# Patient Record
Sex: Female | Born: 1965 | Race: White | Hispanic: No | Smoking: Current every day smoker
Health system: Southern US, Community
[De-identification: ages and names within clinical notes are randomized; demographics above are authoritative.]

## PROBLEM LIST (undated history)

## (undated) DIAGNOSIS — M1990 Unspecified osteoarthritis, unspecified site: Secondary | ICD-10-CM

## (undated) DIAGNOSIS — Z9851 Tubal ligation status: Secondary | ICD-10-CM

## (undated) DIAGNOSIS — I10 Essential (primary) hypertension: Secondary | ICD-10-CM

## (undated) DIAGNOSIS — C801 Malignant (primary) neoplasm, unspecified: Secondary | ICD-10-CM

## (undated) DIAGNOSIS — F329 Major depressive disorder, single episode, unspecified: Secondary | ICD-10-CM

## (undated) DIAGNOSIS — F32A Depression, unspecified: Secondary | ICD-10-CM

## (undated) HISTORY — PX: CARPAL TUNNEL RELEASE: SHX101

## (undated) HISTORY — PX: WISDOM TOOTH EXTRACTION: SHX21

---

## 1998-09-04 ENCOUNTER — Emergency Department (HOSPITAL_COMMUNITY): Admission: EM | Admit: 1998-09-04 | Discharge: 1998-09-04 | Payer: Self-pay | Admitting: Emergency Medicine

## 1998-09-04 ENCOUNTER — Encounter: Payer: Self-pay | Admitting: Emergency Medicine

## 1998-11-29 ENCOUNTER — Emergency Department (HOSPITAL_COMMUNITY): Admission: EM | Admit: 1998-11-29 | Discharge: 1998-11-29 | Payer: Self-pay | Admitting: Emergency Medicine

## 2000-03-11 DIAGNOSIS — Z9851 Tubal ligation status: Secondary | ICD-10-CM

## 2000-03-11 HISTORY — DX: Tubal ligation status: Z98.51

## 2000-05-14 ENCOUNTER — Encounter: Payer: Self-pay | Admitting: Obstetrics and Gynecology

## 2000-05-14 ENCOUNTER — Encounter: Admission: RE | Admit: 2000-05-14 | Discharge: 2000-05-14 | Payer: Self-pay | Admitting: Obstetrics and Gynecology

## 2003-06-16 ENCOUNTER — Ambulatory Visit (HOSPITAL_BASED_OUTPATIENT_CLINIC_OR_DEPARTMENT_OTHER): Admission: RE | Admit: 2003-06-16 | Discharge: 2003-06-16 | Payer: Self-pay | Admitting: Obstetrics and Gynecology

## 2003-06-16 ENCOUNTER — Ambulatory Visit (HOSPITAL_COMMUNITY): Admission: RE | Admit: 2003-06-16 | Discharge: 2003-06-16 | Payer: Self-pay | Admitting: Obstetrics and Gynecology

## 2005-06-06 ENCOUNTER — Ambulatory Visit: Payer: Self-pay | Admitting: Professional

## 2005-06-27 ENCOUNTER — Ambulatory Visit: Payer: Self-pay | Admitting: Professional

## 2006-06-28 ENCOUNTER — Emergency Department (HOSPITAL_COMMUNITY): Admission: EM | Admit: 2006-06-28 | Discharge: 2006-06-28 | Payer: Self-pay | Admitting: Emergency Medicine

## 2006-10-05 ENCOUNTER — Emergency Department (HOSPITAL_COMMUNITY): Admission: EM | Admit: 2006-10-05 | Discharge: 2006-10-05 | Payer: Self-pay | Admitting: Family Medicine

## 2007-05-01 ENCOUNTER — Emergency Department (HOSPITAL_COMMUNITY): Admission: EM | Admit: 2007-05-01 | Discharge: 2007-05-01 | Payer: Self-pay | Admitting: Emergency Medicine

## 2007-05-02 ENCOUNTER — Ambulatory Visit (HOSPITAL_COMMUNITY): Admission: RE | Admit: 2007-05-02 | Discharge: 2007-05-02 | Payer: Self-pay | Admitting: Orthopaedic Surgery

## 2007-05-14 ENCOUNTER — Ambulatory Visit (HOSPITAL_COMMUNITY): Admission: RE | Admit: 2007-05-14 | Discharge: 2007-05-14 | Payer: Self-pay | Admitting: Family Medicine

## 2007-05-22 ENCOUNTER — Encounter: Admission: RE | Admit: 2007-05-22 | Discharge: 2007-05-22 | Payer: Self-pay | Admitting: Family Medicine

## 2007-05-27 ENCOUNTER — Other Ambulatory Visit: Admission: RE | Admit: 2007-05-27 | Discharge: 2007-05-27 | Payer: Self-pay | Admitting: Family Medicine

## 2007-09-23 ENCOUNTER — Emergency Department (HOSPITAL_COMMUNITY): Admission: EM | Admit: 2007-09-23 | Discharge: 2007-09-23 | Payer: Self-pay | Admitting: Emergency Medicine

## 2007-12-15 ENCOUNTER — Ambulatory Visit (HOSPITAL_BASED_OUTPATIENT_CLINIC_OR_DEPARTMENT_OTHER): Admission: RE | Admit: 2007-12-15 | Discharge: 2007-12-15 | Payer: Self-pay | Admitting: Orthopaedic Surgery

## 2008-01-22 ENCOUNTER — Encounter: Admission: RE | Admit: 2008-01-22 | Discharge: 2008-03-10 | Payer: Self-pay | Admitting: Orthopaedic Surgery

## 2008-03-08 ENCOUNTER — Encounter: Admission: RE | Admit: 2008-03-08 | Discharge: 2008-03-08 | Payer: Self-pay | Admitting: Family Medicine

## 2008-03-28 ENCOUNTER — Encounter: Admission: RE | Admit: 2008-03-28 | Discharge: 2008-06-26 | Payer: Self-pay | Admitting: Neurosurgery

## 2009-12-07 ENCOUNTER — Emergency Department (HOSPITAL_COMMUNITY): Admission: EM | Admit: 2009-12-07 | Discharge: 2009-12-07 | Payer: Self-pay | Admitting: Emergency Medicine

## 2009-12-11 ENCOUNTER — Emergency Department (HOSPITAL_COMMUNITY): Admission: EM | Admit: 2009-12-11 | Discharge: 2009-12-11 | Payer: Self-pay | Admitting: Emergency Medicine

## 2009-12-11 ENCOUNTER — Encounter (INDEPENDENT_AMBULATORY_CARE_PROVIDER_SITE_OTHER): Payer: Self-pay | Admitting: Emergency Medicine

## 2009-12-11 ENCOUNTER — Ambulatory Visit: Payer: Self-pay | Admitting: Vascular Surgery

## 2010-04-01 ENCOUNTER — Encounter: Payer: Self-pay | Admitting: Family Medicine

## 2010-04-14 ENCOUNTER — Inpatient Hospital Stay (INDEPENDENT_AMBULATORY_CARE_PROVIDER_SITE_OTHER)
Admission: RE | Admit: 2010-04-14 | Discharge: 2010-04-14 | Disposition: A | Discharge status: Home / Self Care | Payer: Medicaid Other | Source: Ambulatory Visit | Attending: Family Medicine | Admitting: Family Medicine

## 2010-04-14 DIAGNOSIS — M799 Soft tissue disorder, unspecified: Secondary | ICD-10-CM

## 2010-07-24 NOTE — Op Note (Signed)
NAMEBETTY, Brandy Mccormick                ACCOUNT NO.:  [id number]   MEDICAL RECORD NO.:  [id number]          PATIENT TYPE:  AMB   LOCATION:  DSC                          FACILITY:  [facility]   PHYSICIAN:  Lubertha Basque. Dalldorf, M.D.DATE OF BIRTH:  March 09, 1966   DATE OF PROCEDURE:  12/15/2007  DATE OF DISCHARGE:                               OPERATIVE REPORT   PREOPERATIVE DIAGNOSIS:  Left carpal tunnel syndrome.   POSTOPERATIVE DIAGNOSIS:  Left carpal tunnel syndrome.   PROCEDURE:  Left carpal tunnel release.   ANESTHESIA:  General.   ATTENDING SURGEON:  Lubertha Basque. Jerl Santos, MD   ASSISTANT:  Lindwood Qua, PA   INDICATIONS FOR PROCEDURE:  The patient is a 45 year old woman with a  long history of left arm numbness.  This has persisted despite bracing  and observation and oral anti-inflammatories.  She has undergone a nerve  conduction study and EMG study which shows a radiculopathy but also  severe carpal tunnel on this side.  She is offered operative release  with continued symptoms of pain with use of her hand and persistent  numbness.  Informed operative consent was obtained after discussion of  possible complications including reaction to anesthesia, infection, and  neurovascular injury.   SUMMARY OF FINDINGS AND PROCEDURE:  Under general anesthesia through a  small palmar incision, a left carpal tunnel release was performed.  She  had moderate thickening of the transverse carpal ligament.  Her skin was  closed primarily.   DESCRIPTION OF PROCEDURE:  The patient was taken to the operating suite  where general anesthetic was applied without difficulty.  She was then  positioned supine and prepped and draped in normal sterile fashion.  After the administration of IV Kefzol, the left arm was elevated,  exsanguinated, and tourniquet inflated about the upper arm.  A small  palmar incision was made ulnar to the thenar flexion crease.  This did  not cross the wrist flexion crease.   Dissection was carried down through  the palmar fascia to expose the transverse carpal ligament.  This was  released under direct visualization.  The release was taken distally  towards the transverse arch of vessels and proximally through the distal  fascia of the forearm.  The nerve was moderately compressed.  The wound  was irrigated followed by reapproximation of the skin with nylon in  vertical mattress fashion.  The tourniquet was deflated and a small  amount of bleeding was easily controlled with some pressure.  Her  fingers all became pink and warm immediately.  Some Marcaine was  injected about the incision site.  Adaptic was applied followed by dry  gauze and a volar splint of plaster with the wrist in slight extension.  Estimated blood loss and intraoperative fluids can be obtained from  anesthesia records as can accurate tourniquet time.   DISPOSITION:  The patient was extubated in the operating room and taken  to recovery room in stable addition.  She was to go home the same day  and followup in the office in less than a week.  I will contact her by  phone tonight.      Lubertha Basque Jerl Santos, M.D.  Electronically Signed     PGD/MEDQ  D:  12/15/2007  T:  12/15/2007  Job:  [id number]

## 2010-07-27 NOTE — Op Note (Signed)
NAME:  Brandy Mccormick, Brandy Mccormick                          ACCOUNT NO.:  [id number]   MEDICAL RECORD NO.:  [id number]                   PATIENT TYPE:  AMB   LOCATION:  [facility]                                 FACILITY:  [facility]   PHYSICIAN:  Katherine Roan, M.D.               DATE OF BIRTH:  03/19/1965   DATE OF PROCEDURE:  06/15/2003  DATE OF DISCHARGE:                                 OPERATIVE REPORT   PREOPERATIVE DIAGNOSIS:  Desires sterilization.   POSTOPERATIVE DIAGNOSIS:  Desires sterilization.   OPERATION/PROCEDURE:  1. Laparoscopic pelvic exam under anesthesia.  2. Laparoscopic tubal ligation.   DESCRIPTION OF PROCEDURE:  The patient was placed in the lithotomy position,  prepped and draped in the usual fashion.  Pelvic exam under anesthesia  revealed a mid plane uterus with no masses.  Bladder was emptied.  The  patient was then draped for the sterile laparoscopic surgery.  A transverse  incision was made in the umbilicus and the  abdomen was distended with  Veress needle.  Aspiration and infusion technique was utilized.  The abdomen  was distended with about 3 L of carbon dioxide.  Following this, the trocar  was inserted into the abdomen and visualization of the pelvis was  accomplished.  Both ovaries and tubes were completely normal.  There was no  evidence of endometriosis.  The upper quadrants were all normal as well.  The tubes were then cauterized 2 cm lateral to the uterotubal junction and  scissored.  Pictures were obtained.  No unusual blood loss occurred.  The  gas was evacuated and the skin was closed with #1 Vicryl in the umbilicus  and 3-0 plain for the skin and subcuticular skin.  The incision was then  infiltrated with about 20 mL of 0.5% Marcaine with epinephrine.  [name]  tolerated the procedure well and was sent to the recovery room in good  condition.                                               Katherine Roan, M.D.    SDM/MEDQ  D:  06/16/2003  T:   06/16/2003  Job:  [id number]

## 2010-11-30 LAB — BASIC METABOLIC PANEL
BUN: 13
Creatinine, Ser: 0.79
GFR calc non Af Amer: >60
Potassium: 4.4

## 2010-12-10 LAB — POCT HEMOGLOBIN-HEMACUE: Hemoglobin: 15.8 — ABNORMAL HIGH

## 2011-11-07 ENCOUNTER — Emergency Department (HOSPITAL_COMMUNITY)
Admission: EM | Admit: 2011-11-07 | Discharge: 2011-11-07 | Disposition: A | Discharge status: Home / Self Care | Payer: Self-pay | Attending: Emergency Medicine | Admitting: Emergency Medicine

## 2011-11-07 ENCOUNTER — Encounter (HOSPITAL_COMMUNITY): Payer: Self-pay | Admitting: *Deleted

## 2011-11-07 DIAGNOSIS — T1590XA Foreign body on external eye, part unspecified, unspecified eye, initial encounter: Secondary | ICD-10-CM | POA: Insufficient documentation

## 2011-11-07 DIAGNOSIS — F17200 Nicotine dependence, unspecified, uncomplicated: Secondary | ICD-10-CM | POA: Insufficient documentation

## 2011-11-07 DIAGNOSIS — S058X9A Other injuries of unspecified eye and orbit, initial encounter: Secondary | ICD-10-CM | POA: Insufficient documentation

## 2011-11-07 DIAGNOSIS — S0501XA Injury of conjunctiva and corneal abrasion without foreign body, right eye, initial encounter: Secondary | ICD-10-CM

## 2011-11-07 MED ORDER — FLUORESCEIN SODIUM 1 MG OP STRP
ORAL_STRIP | OPHTHALMIC | Status: AC
  Administered 2011-11-07: 12:00:00
  Filled 2011-11-07: qty 1

## 2011-11-07 MED ORDER — TOBRAMYCIN 0.3 % OP SOLN
2.0000 [drp] | OPHTHALMIC | Status: DC
  Administered 2011-11-07: 2 [drp] via OPHTHALMIC
  Filled 2011-11-07: qty 5

## 2011-11-07 MED ORDER — TETRACAINE HCL 0.5 % OP SOLN
OPHTHALMIC | Status: AC
  Administered 2011-11-07: 12:00:00
  Filled 2011-11-07: qty 2

## 2011-11-07 MED ORDER — TRAMADOL-ACETAMINOPHEN 37.5-325 MG PO TABS: ORAL_TABLET | ORAL | Status: AC

## 2011-11-07 MED ORDER — TETANUS-DIPHTHERIA TOXOIDS TD 5-2 LFU IM INJ: 0.5000 mL | INJECTION | Freq: Once | INTRAMUSCULAR | Status: DC

## 2011-11-07 NOTE — ED Notes (Signed)
Pt reports dog stuck her in right eye last night. Reports pain denies blurred vision. Reports lots of tearing.

## 2011-11-07 NOTE — ED Provider Notes (Cosign Needed)
History  Scribed for Ward Givens, MD, the patient was seen in room TR02C/TR02C. This chart was scribed by Candelaria Stagers. The patient's care started at 11:31 AM   CSN: [id number]  Arrival date & time 11/07/11  4098   First MD Initiated Contact with Patient 11/07/11 1057      Chief Complaint  Patient presents with  . Eye Problem    The history is provided by the patient. No language interpreter was used.   Brandy Mccormick is a 46 y.o. female who presents to the Emergency Department complaining of right eye pain after her dog hit her in the eye with his paw yesterday.  Pt describes the pain as a tearing and sometimes foreign body feeling.  She states that the pain is worse when blinking and the pain is relieved with closing the eye.  She denies photophobia or blurred vision.  States she had difficulty sleeping b/o pain. Last tetanus shot is unknown.      PCP is Dr. Selena Batten  History reviewed. No pertinent past medical history.  History reviewed. No pertinent past surgical history.  History reviewed. No pertinent family history.  History  Substance Use Topics  . Smoking status: Current Everyday Smoker  . Smokeless tobacco: Not on file  . Alcohol Use: Yes     occ  employed  OB History    Grav Para Term Preterm Abortions TAB SAB Ect Mult Living                  Review of Systems  Eyes: Positive for pain (right eye pain). Negative for photophobia and visual disturbance.  All other systems reviewed and are negative.    Allergies  Review of patient's allergies indicates no known allergies.  Home Medications  No current outpatient prescriptions on file.  BP 147/84  Pulse 86  Temp 98.3 F (36.8 C) (Oral)  Resp 20  SpO2 97%  Vital signs normal    Physical Exam  Nursing note and vitals reviewed. Constitutional: She is oriented to person, place, and time. She appears well-developed and well-nourished. No distress.  HENT:  Head: Normocephalic and atraumatic.    Right Ear: External ear normal.  Left Ear: External ear normal.  Eyes: Conjunctivae and EOM are normal. Pupils are equal, round, and reactive to light. Right eye exhibits no discharge. Left eye exhibits no discharge.         Faint uptake at about eleven o'clock near the edge of the corneawith florescent stain.  Both eyes injected bilaterally.     Neck: Normal range of motion.  Pulmonary/Chest: Effort normal. No respiratory distress.  Musculoskeletal: Normal range of motion.  Neurological: She is alert and oriented to person, place, and time.  Skin: Skin is warm and dry. She is not diaphoretic.  Psychiatric: She has a normal mood and affect. Her behavior is normal.    ED Course  Procedures   Medications  tetracaine (PONTOCAINE) 0.5 % ophthalmic solution (   Given 11/07/11 1144)  fluorescein 1 MG ophthalmic strip (   Given 11/07/11 1145)     DIAGNOSTIC STUDIES: Oxygen Saturation is 97% on room air, normal by my interpretation.    COORDINATION OF CARE:   1. Corneal abrasion, right     New Prescriptions   TRAMADOL-ACETAMINOPHEN (ULTRACET) 37.5-325 MG PER TABLET    2 tabs po QID prn pain    Plan discharge  Devoria Albe, MD, FACEP   MDM   I personally performed the services  described in this documentation, which was scribed in my presence. The recorded information has been reviewed and considered.  Devoria Albe, MD, FACEP       Ward Givens, MD 11/07/11 Barnie Mort  Ward Givens, MD 11/07/11 [phone]

## 2011-11-07 NOTE — ED Notes (Signed)
Both eye are red . Pt is c/o pain to the right eye but no blurred vision

## 2013-02-15 ENCOUNTER — Emergency Department (HOSPITAL_BASED_OUTPATIENT_CLINIC_OR_DEPARTMENT_OTHER)
Admission: EM | Admit: 2013-02-15 | Discharge: 2013-02-15 | Disposition: A | Discharge status: Home / Self Care | Payer: Self-pay | Attending: Emergency Medicine | Admitting: Emergency Medicine

## 2013-02-15 ENCOUNTER — Encounter (HOSPITAL_BASED_OUTPATIENT_CLINIC_OR_DEPARTMENT_OTHER): Payer: Self-pay | Admitting: Emergency Medicine

## 2013-02-15 ENCOUNTER — Emergency Department (HOSPITAL_BASED_OUTPATIENT_CLINIC_OR_DEPARTMENT_OTHER): Payer: Self-pay

## 2013-02-15 DIAGNOSIS — F17200 Nicotine dependence, unspecified, uncomplicated: Secondary | ICD-10-CM | POA: Insufficient documentation

## 2013-02-15 DIAGNOSIS — Z8659 Personal history of other mental and behavioral disorders: Secondary | ICD-10-CM | POA: Insufficient documentation

## 2013-02-15 DIAGNOSIS — M542 Cervicalgia: Secondary | ICD-10-CM | POA: Insufficient documentation

## 2013-02-15 DIAGNOSIS — R51 Headache: Secondary | ICD-10-CM | POA: Insufficient documentation

## 2013-02-15 HISTORY — DX: Depression, unspecified: F32.A

## 2013-02-15 HISTORY — DX: Major depressive disorder, single episode, unspecified: F32.9

## 2013-02-15 LAB — CSF CELL COUNT WITH DIFFERENTIAL

## 2013-02-15 LAB — GLUCOSE, CSF: Glucose, CSF: 51 mg/dL (ref 43–76)

## 2013-02-15 MED ORDER — METOCLOPRAMIDE HCL 5 MG/ML IJ SOLN
10.0000 mg | Freq: Once | INTRAMUSCULAR | Status: AC
  Administered 2013-02-15: 10 mg via INTRAVENOUS
  Filled 2013-02-15: qty 2

## 2013-02-15 MED ORDER — SODIUM CHLORIDE 0.9 % IV BOLUS (SEPSIS)
1000.0000 mL | Freq: Once | INTRAVENOUS | Status: AC
  Administered 2013-02-15: 1000 mL via INTRAVENOUS

## 2013-02-15 MED ORDER — DEXAMETHASONE SODIUM PHOSPHATE 10 MG/ML IJ SOLN
10.0000 mg | Freq: Once | INTRAMUSCULAR | Status: AC
  Administered 2013-02-15: 10 mg via INTRAVENOUS
  Filled 2013-02-15: qty 1

## 2013-02-15 MED ORDER — DIPHENHYDRAMINE HCL 50 MG/ML IJ SOLN
25.0000 mg | Freq: Once | INTRAMUSCULAR | Status: AC
  Administered 2013-02-15: 25 mg via INTRAVENOUS
  Filled 2013-02-15: qty 1

## 2013-02-15 NOTE — ED Notes (Signed)
Headache that started yesterday at 0400.  She reports elevated BP yesterday 186/95.

## 2013-02-15 NOTE — ED Provider Notes (Signed)
CSN: [id number]     Arrival date & time 02/15/13  4098 History   First MD Initiated Contact with Patient 02/15/13 [phone]     Chief Complaint  Patient presents with  . Headache   (Consider location/radiation/quality/duration/timing/severity/associated sxs/prior Treatment) Patient is a 47 y.o. female presenting with headaches.  Headache Pain location:  Generalized Quality: Pressure. Radiates to: Neck. Severity currently:  7/10 Pain scale at highest: 15/10" Onset quality:  Sudden Duration:  28 hours Timing:  Constant Progression since onset: Gradually improving. Chronicity:  New Similar to prior headaches: no   Context comment:  Awoke from sleep Relieved by:  Nothing Exacerbated by: Not worse with light. Ineffective treatments: Over-the-counter medications. Associated symptoms: neck pain   Associated symptoms: no abdominal pain, no blurred vision, no congestion, no cough, no diarrhea, no fever, no nausea, no neck stiffness, no paresthesias, no photophobia, no visual change and no vomiting     Past Medical History  Diagnosis Date  . Depression    History reviewed. No pertinent past surgical history. No family history on file. History  Substance Use Topics  . Smoking status: Current Every Day Smoker -- 1.00 packs/day    Types: Cigarettes  . Smokeless tobacco: Not on file  . Alcohol Use: Yes     Comment: weekends   OB History   Grav Para Term Preterm Abortions TAB SAB Ect Mult Living                 Review of Systems  Constitutional: Negative for fever.  HENT: Negative for congestion.   Eyes: Negative for blurred vision and photophobia.  Respiratory: Negative for cough and shortness of breath.   Cardiovascular: Negative for chest pain.  Gastrointestinal: Negative for nausea, vomiting, abdominal pain and diarrhea.  Musculoskeletal: Positive for neck pain. Negative for neck stiffness.  Neurological: Positive for headaches. Negative for paresthesias.  All other systems  reviewed and are negative.    Allergies  Review of patient's allergies indicates no known allergies.  Home Medications  No current outpatient prescriptions on file. BP 148/85  Pulse 84  Temp(Src) 97.5 F (36.4 C) (Oral)  Resp 16  Ht 5\' 3"  (1.6 m)  Wt 130 lb (58.968 kg)  BMI 23.03 kg/m2  SpO2 100% Physical Exam  Nursing note and vitals reviewed. Constitutional: She is oriented to person, place, and time. She appears well-developed and well-nourished. No distress.  HENT:  Head: Normocephalic and atraumatic.  Eyes: Conjunctivae are normal. No scleral icterus.  Neck: Neck supple.  Cardiovascular: Normal rate and intact distal pulses.   Pulmonary/Chest: Effort normal. No stridor. No respiratory distress.  Abdominal: Normal appearance. She exhibits no distension.  Neurological: She is alert and oriented to person, place, and time. She has normal strength. No cranial nerve deficit or sensory deficit. Coordination and gait normal. GCS eye subscore is 4. GCS verbal subscore is 5. GCS motor subscore is 6.  Reflex Scores:      Patellar reflexes are 2+ on the right side and 2+ on the left side. Skin: Skin is warm and dry. No rash noted.  Psychiatric: She has a normal mood and affect. Her behavior is normal.    ED Course  LUMBAR PUNCTURE Date/Time: 02/15/2013 11:20 AM Performed by: Blake Divine DAVID Authorized by: Blake Divine DAVID Consent: Verbal consent obtained. written consent obtained. Risks and benefits: risks, benefits and alternatives were discussed Consent given by: patient Patient identity confirmed: verbally with patient Time out: Immediately prior to procedure a "time out" was called  to verify the correct patient, procedure, equipment, support staff and site/side marked as required. Indications: evaluation for subarachnoid hemorrhage Local anesthetic: lidocaine 1% without epinephrine Anesthetic total: 3 ml Preparation: Patient was prepped and draped in the usual  sterile fashion. Lumbar space: L4-L5 interspace Patient's position: left lateral decubitus Needle gauge: 20 Needle type: spinal needle - Quincke tip Needle length: 3.5 in Number of attempts: 1 Fluid appearance: clear Tubes of fluid: 4 Total volume: 4 ml Post-procedure: site cleaned and adhesive bandage applied Patient tolerance: Patient tolerated the procedure well with no immediate complications.   (including critical care time) Labs Review Labs Reviewed  CSF CELL COUNT WITH DIFFERENTIAL - Abnormal; Notable for the following:    Appearance, CSF CLEAR (*)    RBC Count, CSF 29 (*)    All other components within normal limits  CSF CELL COUNT WITH DIFFERENTIAL - Abnormal; Notable for the following:    Appearance, CSF CLEAR (*)    RBC Count, CSF 1 (*)    All other components within normal limits  PROTEIN, CSF - Abnormal; Notable for the following:    Total  Protein, CSF 56 (*)    All other components within normal limits  CSF CULTURE  GLUCOSE, CSF   Imaging Review Ct Head Wo Contrast  02/15/2013   CLINICAL DATA:  Posterior headache for 1 day.  EXAM: CT HEAD WITHOUT CONTRAST  TECHNIQUE: Contiguous axial images were obtained from the base of the skull through the vertex without intravenous contrast.  COMPARISON:  None.  FINDINGS: The ventricles and sulci are within normal limits for age. There is no evidence of acute infarct, intracranial hemorrhage, mass, midline shift, or extra-axial collection. The orbits are unremarkable. The visualized paranasal sinuses and mastoid air cells are clear. There is no evidence of acute fracture.  IMPRESSION: Unremarkable head CT.   Electronically Signed   By: Sebastian Ache   On: 02/15/2013 08:56  All radiology studies independently viewed by me.     EKG Interpretation    Date/Time:  Monday February 15 2013 08:04:31 EST Ventricular Rate:  78 PR Interval:  146 QRS Duration: 80 QT Interval:  420 QTC Calculation: 478 R Axis:   49 Text  Interpretation:  Normal sinus rhythm Normal ECG not  Confirmed by [facility]  MD, TREY ([id number]) on 02/15/2013 8:27:48 AM            MDM   1. Headache    47 year old female with sudden onset, severe generalized headache. Described as worst of her life. She occasionally gets sinus headaches, but not like this. Plan CT, IV metoclopramide, diphenhydramine, dexamethasone, and fluids  Headache resolved with meds.  CT negative.  We discussed risks and benefits of LP to rule out [facility] and elected to proceed with procedure.  It went without difficulty.  Labs pending.  LP results reassuring.  RBCs went from 29 in tube 1 to 1 in tube 4.  Pt remained well appearing and symptom free.    Candyce Churn, MD 02/15/13 [phone]

## 2013-02-18 LAB — CSF CULTURE W GRAM STAIN: Culture: NO GROWTH

## 2014-01-18 ENCOUNTER — Emergency Department (HOSPITAL_COMMUNITY)
Admission: EM | Admit: 2014-01-18 | Discharge: 2014-01-18 | Disposition: A | Discharge status: Home / Self Care | Payer: Self-pay | Attending: Emergency Medicine | Admitting: Emergency Medicine

## 2014-01-18 ENCOUNTER — Encounter (HOSPITAL_COMMUNITY): Payer: Self-pay | Admitting: Emergency Medicine

## 2014-01-18 ENCOUNTER — Emergency Department (HOSPITAL_COMMUNITY): Payer: Self-pay

## 2014-01-18 DIAGNOSIS — Z720 Tobacco use: Secondary | ICD-10-CM | POA: Insufficient documentation

## 2014-01-18 DIAGNOSIS — Z8659 Personal history of other mental and behavioral disorders: Secondary | ICD-10-CM | POA: Insufficient documentation

## 2014-01-18 DIAGNOSIS — M25462 Effusion, left knee: Secondary | ICD-10-CM | POA: Insufficient documentation

## 2014-01-18 DIAGNOSIS — M25562 Pain in left knee: Secondary | ICD-10-CM | POA: Insufficient documentation

## 2014-01-18 MED ORDER — IBUPROFEN 600 MG PO TABS: 600.0000 mg | ORAL_TABLET | Freq: Four times a day (QID) | ORAL | Status: DC | PRN

## 2014-01-18 MED ORDER — ACETAMINOPHEN 500 MG PO TABS
1000.0000 mg | ORAL_TABLET | Freq: Once | ORAL | Status: AC
  Administered 2014-01-18: 1000 mg via ORAL
  Filled 2014-01-18: qty 2

## 2014-01-18 NOTE — ED Provider Notes (Signed)
CSN: [id number]     Arrival date & time 01/18/14  [id number] History   First MD Initiated Contact with Patient 01/18/14 [phone]     Chief Complaint  Patient presents with  . Knee Pain     (Consider location/radiation/quality/duration/timing/severity/associated sxs/prior Treatment) HPI Brandy Mccormick is a 48 year old female with past medical history of depression who presents the ER with left knee pain. Patient reports noticing pain, skin swelling, and a "locking sensation" which is been occurring more frequently over the past month. Patient denies any specific trauma to her knee, and states that she is walking a lot on a daily basis, and uses steps frequently at her house. Patient states her pain is the most severe in the morning, as the day goes on the pain decreases slightly. Patient states that she uses her knee throughout the day her pain increases. She states she has been trying ice and elevating her knee at home. She denies numbness, tingling, weakness, redness, fever, nausea, vomiting.   Past Medical History  Diagnosis Date  . Depression    History reviewed. No pertinent past surgical history. Family History  Problem Relation Age of Onset  . Cancer Other    History  Substance Use Topics  . Smoking status: Current Every Day Smoker -- 1.00 packs/day    Types: Cigarettes  . Smokeless tobacco: Not on file  . Alcohol Use: Yes     Comment: weekends   OB History    No data available     Review of Systems  Constitutional: Negative for fever.  Gastrointestinal: Negative for nausea and vomiting.  Musculoskeletal: Positive for arthralgias.  Neurological: Negative for weakness and numbness.      Allergies  Review of patient's allergies indicates no known allergies.  Home Medications   Prior to Admission medications   Medication Sig Start Date End Date Taking? Authorizing Provider  ibuprofen (ADVIL,MOTRIN) 600 MG tablet Take 1 tablet (600 mg total) by mouth every 6 (six) hours as  needed. 01/18/14   Monte FantasiaJoseph W Lavone Barrientes, PA-C   BP 101/60 mmHg  Pulse 71  Temp(Src) 99 F (37.2 C) (Oral)  Resp 16  Ht 5\' 3"  (1.6 m)  Wt 135 lb (61.236 kg)  BMI 23.92 kg/m2  SpO2 96% Physical Exam  Constitutional: She appears well-developed and well-nourished. No distress.  HENT:  Head: Normocephalic and atraumatic.  Eyes: Conjunctivae are normal. Right eye exhibits no discharge. Left eye exhibits no discharge. No scleral icterus.  Cardiovascular:  Peripheral pulses intact at injured extremity.   Pulmonary/Chest: Effort normal. No respiratory distress.  Musculoskeletal:  Left knee exam: Mild effusion noted with inspection.patient has 180 of extension, 80 of flexion without pain. No anterior/posterior or medial/lateral instability. Mild tenderness noted with valgus stress. Mild joint line tenderness noted. McMurray sign negative.DP pulse 2+. Distal sensation intact. Motor strength 5 out of 5 at hip, knee, ankle.  Neurological: She is alert.  No numbness distal to injury.    Skin: Skin is warm and dry. No rash noted. She is not diaphoretic.  Nursing note and vitals reviewed.   ED Course  Procedures (including critical care time) Labs Review Labs Reviewed - No data to display  Imaging Review Dg Knee 2 Views Left  01/18/2014   CLINICAL DATA:  Patient tripped over cat 1 week prior with pain and stiffness  EXAM: LEFT KNEE - 1-2 VIEW  COMPARISON:  Left knee MRI May 02, 2007  FINDINGS: Frontal and lateral views were obtained. There is a moderate joint effusion.  No fracture or dislocation. There is no appreciable joint space narrowing. There is a minimal spur medially. No erosive change.  IMPRESSION: Moderate effusion. No fracture or dislocation. Minimal spurring medially.   Electronically Signed   By: Bretta BangWilliam  Woodruff M.D.   On: 01/18/2014 08:25     EKG Interpretation None      MDM   Final diagnoses:  Knee joint pain, left    Patient here with one month of knee pain with  obvious mild effusion. Neuro exam intact, no obvious instability, or signs of injury. Radiographs show mild spurring consistent with possible degenerative disease. We will discharge patient, and encourage RICE therapy and use of ibuprofen for pain and swelling. I encouraged patient to follow up with orthopedics, or she may follow-up with primary care provider as well. I discussed return precautions with patient, and encourage her to call or return to the ER should her symptoms change, worsen or should she have any questions or concerns.  BP 101/60 mmHg  Pulse 71  Temp(Src) 99 F (37.2 C) (Oral)  Resp 16  Ht 5\' 3"  (1.6 m)  Wt 135 lb (61.236 kg)  BMI 23.92 kg/m2  SpO2 96%  Signed,  Ladona MowJoe Avory Mimbs, PA-C 5:37 PM   Monte FantasiaJoseph W Mehek Grega, PA-C 01/18/14 1737  Toy CookeyMegan Docherty, MD 01/18/14 2052

## 2014-01-18 NOTE — ED Notes (Signed)
Pt states she has been having problems with her left knee  Pt states it has been swelling, locking up on her, and has fluid on it   Pt states this has been going on for about a month

## 2014-01-18 NOTE — ED Notes (Signed)
Patient transported to X-ray 

## 2014-01-18 NOTE — ED Notes (Signed)
MD at bedside. 

## 2014-01-18 NOTE — Discharge Instructions (Signed)
Arthralgia °Your caregiver has diagnosed you as suffering from an arthralgia. Arthralgia means there is pain in a joint. This can come from many reasons including: °· Bruising the joint which causes soreness (inflammation) in the joint. °· Wear and tear on the joints which occur as we grow older (osteoarthritis). °· Overusing the joint. °· Various forms of arthritis. °· Infections of the joint. °Regardless of the cause of pain in your joint, most of these different pains respond to anti-inflammatory drugs and rest. The exception to this is when a joint is infected, and these cases are treated with antibiotics, if it is a bacterial infection. °HOME CARE INSTRUCTIONS  °· Rest the injured area for as long as directed by your caregiver. Then slowly start using the joint as directed by your caregiver and as the pain allows. Crutches as directed may be useful if the ankles, knees or hips are involved. If the knee was splinted or casted, continue use and care as directed. If an stretchy or elastic wrapping bandage has been applied today, it should be removed and re-applied every 3 to 4 hours. It should not be applied tightly, but firmly enough to keep swelling down. Watch toes and feet for swelling, bluish discoloration, coldness, numbness or excessive pain. If any of these problems (symptoms) occur, remove the ace bandage and re-apply more loosely. If these symptoms persist, contact your caregiver or return to this location. °· For the first 24 hours, keep the injured extremity elevated on pillows while lying down. °· Apply ice for 15-20 minutes to the sore joint every couple hours while awake for the first half day. Then 03-04 times per day for the first 48 hours. Put the ice in a plastic bag and place a towel between the bag of ice and your skin. °· Wear any splinting, casting, elastic bandage applications, or slings as instructed. °· Only take over-the-counter or prescription medicines for pain, discomfort, or fever as  directed by your caregiver. Do not use aspirin immediately after the injury unless instructed by your physician. Aspirin can cause increased bleeding and bruising of the tissues. °· If you were given crutches, continue to use them as instructed and do not resume weight bearing on the sore joint until instructed. °Persistent pain and inability to use the sore joint as directed for more than 2 to 3 days are warning signs indicating that you should see a caregiver for a follow-up visit as soon as possible. Initially, a hairline fracture (break in bone) may not be evident on X-rays. Persistent pain and swelling indicate that further evaluation, non-weight bearing or use of the joint (use of crutches or slings as instructed), or further X-rays are indicated. X-rays may sometimes not show a small fracture until a week or 10 days later. Make a follow-up appointment with your own caregiver or one to whom we have referred you. A radiologist (specialist in reading X-rays) may read your X-rays. Make sure you know how you are to obtain your X-ray results. Do not assume everything is normal if you do not hear from us. °SEEK MEDICAL CARE IF: °Bruising, swelling, or pain increases. °SEEK IMMEDIATE MEDICAL CARE IF:  °· Your fingers or toes are numb or blue. °· The pain is not responding to medications and continues to stay the same or get worse. °· The pain in your joint becomes severe. °· You develop a fever over 102° F (38.9° C). °· It becomes impossible to move or use the joint. °MAKE SURE YOU:  °·   Understand these instructions. °· Will watch your condition. °· Will get help right away if you are not doing well or get worse. °Document Released: 02/25/2005 Document Revised: 05/20/2011 Document Reviewed: 10/14/2007 °ExitCare® Patient Information ©2015 [employer]. This information is not intended to replace advice given to you by your health care provider. Make sure you discuss any questions you have with your health care  provider. °Knee Effusion °The medical term for having fluid in your knee is effusion. This is often due to an internal derangement of the knee. This means something is wrong inside the knee. Some of the causes of fluid in the knee may be torn cartilage, a torn ligament, or bleeding into the joint from an injury. Your knee is likely more difficult to bend and move. This is often because there is increased pain and pressure in the joint. The time it takes for recovery from a knee effusion depends on different factors, including:  °· Type of injury. °· Your age. °· Physical and medical conditions. °· Rehabilitation Strategies. °How long you will be away from your normal activities will depend on what kind of knee problem you have and how much damage is present. Your knee has two types of cartilage. Articular cartilage covers the bone ends and lets your knee bend and move smoothly. Two menisci, thick pads of cartilage that form a rim inside the joint, help absorb shock and stabilize your knee. Ligaments bind the bones together and support your knee joint. Muscles move the joint, help support your knee, and take stress off the joint itself. °CAUSES  °Often an effusion in the knee is caused by an injury to one of the menisci. This is often a tear in the cartilage. Recovery after a meniscus injury depends on how much meniscus is damaged and whether you have damaged other knee tissue. Small tears may heal on their own with conservative treatment. Conservative means rest, limited weight bearing activity and muscle strengthening exercises. Your recovery may take up to 6 weeks.  °TREATMENT  °Larger tears may require surgery. Meniscus injuries may be treated during arthroscopy. Arthroscopy is a procedure in which your surgeon uses a small telescope like instrument to look in your knee. Your caregiver can make a more accurate diagnosis (learning what is wrong) by performing an arthroscopic procedure. °If your injury is on the  inner margin of the meniscus, your surgeon may trim the meniscus back to a smooth rim. In other cases your surgeon will try to repair a damaged meniscus with stitches (sutures). This may make rehabilitation take longer, but may provide better long term result by helping your knee keep its shock absorption capabilities. °Ligaments which are completely torn usually require surgery for repair. °HOME CARE INSTRUCTIONS °· Use crutches as instructed. °· If a brace is applied, use as directed. °· Once you are home, an ice pack applied to your swollen knee may help with discomfort and help decrease swelling. °· Keep your knee raised (elevated) when you are not up and around or on crutches. °· Only take over-the-counter or prescription medicines for pain, discomfort, or fever as directed by your caregiver. °· Your caregivers will help with instructions for rehabilitation of your knee. This often includes strengthening exercises. °· You may resume a normal diet and activities as directed. °SEEK MEDICAL CARE IF:  °· There is increased swelling in your knee. °· You notice redness, swelling, or increasing pain in your knee. °· An unexplained oral temperature above 102° F (38.9° C) develops. °  SEEK IMMEDIATE MEDICAL CARE IF:  °· You develop a rash. °· You have difficulty breathing. °· You have any allergic reactions from medications you may have been given. °· There is severe pain with any motion of the knee. °MAKE SURE YOU:  °· Understand these instructions. °· Will watch your condition. °· Will get help right away if you are not doing well or get worse. °Document Released: 05/18/2003 Document Revised: 05/20/2011 Document Reviewed: 07/22/2007 °ExitCare® Patient Information ©2015 [employer]. This information is not intended to replace advice given to you by your health care provider. Make sure you discuss any questions you have with your health care provider. ° °Emergency Department Resource Guide °1) Find a Doctor and Pay Out  of Pocket °Although you won't have to find out who is covered by your insurance plan, it is a good idea to ask around and get recommendations. You will then need to call the office and see if the doctor you have chosen will accept you as a new patient and what types of options they offer for patients who are self-pay. Some doctors offer discounts or will set up payment plans for their patients who do not have insurance, but you will need to ask so you aren't surprised when you get to your appointment. ° °2) Contact Your Local Health Department °Not all health departments have doctors that can see patients for sick visits, but many do, so it is worth a call to see if yours does. If you don't know where your local health department is, you can check in your phone book. The CDC also has a tool to help you locate your state's health department, and many state websites also have listings of all of their local health departments. ° °3) Find a Walk-in Clinic °If your illness is not likely to be very severe or complicated, you may want to try a walk in clinic. These are popping up all over the country in pharmacies, drugstores, and shopping centers. They're usually staffed by nurse practitioners or physician assistants that have been trained to treat common illnesses and complaints. They're usually fairly quick and inexpensive. However, if you have serious medical issues or chronic medical problems, these are probably not your best option. ° °No Primary Care Doctor: °- Call [facility] at  832-8000 - they can help you locate a primary care doctor that  accepts your insurance, provides certain services, etc. °- Physician Referral Service- 1-800-533-3463 ° °Chronic Pain Problems: °Organization         Address  Phone   Notes  °[facility]  (336) 297-2271 Patients need to be referred by their primary care doctor.  ° °Medication Assistance: °Organization         Address  Phone   Notes  °[place]  [facility] [address] °[place], [place] [address] (336) 641-8030 --Must be a resident of [place] °-- Must have NO insurance coverage whatsoever (no Medicaid/ Medicare, etc.) °-- The pt. MUST have a primary care doctor that directs their care regularly and follows them in the community °  °[facility]  (866) 331-1348   °[employer]  (888) 892-1162   ° °Agencies that provide inexpensive medical care: °Organization         Address  Phone   Notes  °[facility]  (336) 832-8035   °[facility]    (336) 832-7272   °[facility] [address] °[place],   [place] [address] (336) 832-4777   °[facility] [address], °[place] (336) 271-4999   °[facility]    (336) 373-0678   °[facility]    (336) 272-1050   °[facility] ° [address], [place] Phone:  (336) 832-4444, Fax:  (336) 832-4440 Hours of Operation:  9 am - 6 pm, M-F.  Also accepts Medicaid/Medicare and self-pay.  °[facility] ° [address], [place] Phone: (336) [phone], Fax: (336) [phone]. Hours of Operation:  8:30 am - 5:30 pm, M-F.  Also accepts Medicaid and self-pay.  °[facility] [place] [address], [place] Phone: (336) 878-6027   °[facility] [address], [place], [place] (336)723-1848, Ext. 123 Mondays & Thursdays: 7-9 AM.  First 15 patients are seen on a first come, first serve basis. °  ° °Medicaid-accepting [place] Providers: ° °Organization         Address  Phone   Notes  °[facility] [address], [place] (336) 641-2100 Also accepts self-pay patients.  °[facility] [address], [place] ° (336) 856-9996   °[facility] [address], [place] (336) 288-8857   °[facility] [address] [place]  [address], [place] (336) 299-7000   °Veita Bland [address], [place]  ° (336) 373-1557 Only accepts [place] Access Medicaid patients after they have their name applied to their card.  ° °Self-Pay (no insurance) in [place]: ° °Organization         Address  Phone   Notes  °Sickle Cell Patients, [facility] [address], [place] (336) 832-1970   °[facility] [address], [place] (336) 832-4400   °[facility] ° [address], [place] (336) 992-4800   °[facility]/Dr. Osei-Bonsu ° [address] [place] [address], [place] or [address], [place] (336) 841-8500 Phone number for both [place] and [place] locations is the same.  °[facility] [address], [place] (336) 299-0000   °[facility] [address] [place] [address], [place] or [address] (336) 852-7530 °(336) 878-2260   °[facility] [address], [place] (336) 350-1642, phone; (336) 294-5005, fax Sees patients 1st and 3rd Saturday of every month.  Must not qualify for public or private insurance (i.e. Medicaid, Medicare, [place] Health Choice, Veterans' Benefits) • Household income should be no more than 200% of the poverty level •The clinic cannot treat you if you are pregnant or think you are pregnant • Sexually transmitted diseases are not treated at the clinic.  ° ° °Dental Care: °Organization         Address  Phone  Notes  °[facility] [address], [place] (336) 641-6152 Accepts children up to age 21 who are enrolled in Medicaid or [place] Health Choice; pregnant women with a Medicaid card; and children who have applied for Medicaid or [place] Health Choice, but were declined, whose parents can pay a reduced fee at time of service.  °[facility] [place]  [address], [place]  (336) 641-7733 Accepts children up to age 21 who are enrolled in Medicaid or  Health Choice; pregnant   women with a Medicaid card; and children who have applied for Medicaid or [place] Health Choice, but were declined, whose parents can pay a reduced fee at time of service.  °[facility] ° [address], [place] (336) 641-4533 Patients are seen by appointment only. Walk-ins are not accepted. [facility] will see patients 18 years of age and older. °Monday - Tuesday (8am-5pm) °Most Wednesdays (8:30-5pm) °$30 per visit, cash only  °[facility] ° [address], [place] (336) 641-4533 Patients are seen by appointment only. Walk-ins are not accepted. [facility] will see patients 18 years of age and older. °One Wednesday Evening (Monthly: Volunteer Based).  $30 per visit, cash only  °[facility]  (919) 537-3737 for adults; Children under age 4, call Graduate Pediatric Dentistry at ([phone]) [phone]. Children aged 4-14, please call ([phone]) [phone] to request a pediatric application. ° Dental services are provided in all areas of dental care including fillings, crowns and bridges, complete and partial dentures, implants, gum treatment, root canals, and extractions. Preventive care is also provided. Treatment is provided to both adults and children. °Patients are selected via a lottery and there is often a waiting list. °  °[facility] [address], °[place] ° (336) 763-8833 [email or web address] °  °[facility] [address], [place], [place] (336)723-1848, Ext. 123 Second and Fourth Thursday of each month, opens at 6:30 AM; Clinic ends at 9 AM.  Patients are seen on a first-come first-served basis, and a limited number are seen during each clinic.  ° °[facility] ° [address], [place], [place] (336) 723-7904   Eligibility Requirements °You must have lived in [place], [place], or [place]  counties for at least the last three months. °  You cannot be eligible for state or federal sponsored healthcare insurance, including Veterans Administration, Medicaid, or Medicare. °  You generally cannot be eligible for healthcare insurance through your employer.  °  How to apply: °Eligibility screenings are held every Tuesday and Wednesday afternoon from 1:00 pm until 4:00 pm. You do not need an appointment for the interview!  °[facility] [address], [place], [place] 336-631-2330   °[facility]  336-342-8273   °[facility]  336-703-3100   °[facility]  336-570-6415   ° °Behavioral Health Resources in the Community: °Intensive Outpatient Programs °Organization         Address  Phone  Notes  °[facility] [address], [place], [place] 336-878-6098   °[facility] [address], [place], [place] 336-832-9800   °[facility] [address], [place], [place] ° 336-882-2125   °[facility] [address],  °[place],  1-800-853-5163 or 336-641-4981   °Substance Abuse Resources °Organization         Address  Phone  Notes  °[facility]  336-882-2125   °[facility]  336-784-9470   °[facility]  336-285-9073   °[facility]  336-845-3988   °Residential & Outpatient Substance Abuse Program  1-800-659-3381   °Psychological Services °Organization         Address  Phone  Notes  °[facility]  336- 832-9600   °[facility]  336- 378-7881   °[facility] [address],  1-800-853-5163 or 336-641-4981   ° °Mobile Crisis Teams °  Organization         Address  Phone  Notes  °[facility], Mobile Crisis Care Unit  1-877-626-1772   °Assertive °[facility] ° [address] [place], [place] 336-834-9664   °Sharon DeEsch [address] °[place]  [place] 336-554-5454   ° °Self-Help/Support Groups °Organization         Address  Phone             Notes  °[facility] - variety of support groups  336- 373-1402 Call for more information  °Narcotics Anonymous (NA), [facility] [address], °[place]  2 meetings at this location  ° °Residential Treatment Programs °Organization         Address  Phone  Notes  °[facility] [address],    °[place]  1-866-801-8205   °[facility] ° [address], [place], [place] 704-293-8524   °[facility] [address], [place] 336-845-3988 Admissions: 8am-3pm M-F  °[facility] [address]    °[place], [place] 336-841-1104   °[facility] [address], [place], [place] 336-379-7146   °[facility] [address]  °[place], [place] 336-285-9073   °[facility] - Intensive Outpatient [address], [place], [place] 336-852-3033   °[facility] [address]  °[place], [place] 1-877-615-2722 or 336-784-9470   °[facility] [address] [place], [place] 336-227-7417 Accepts Medicaid  °[facility] [address]  °[place] 1-800-659-3381 Substance Abuse/Addiction Treatment  ° °[place] Behavioral Health Resources °Organization         Address  Phone  Notes  °[facility]  (888) 581-9988   °Julie Brannon, PhD [address] [place], [place]   (336) 349-5553 or (336) 951-0000   °[facility]   [address] °[place], [place] (336) 349-4454   °[facility] [address], [place], [place] (336) 342-8316 Insurance/Medicaid/sponsorship through [facility]  °[facility] [address]                                    [place], [place] (336) 342-8316 Therapy/tele-psych/case  °[facility] [address]  ° [place],  (336) 349-2233    °Dr. Arfeen  (336)  349-4544   °[facility] [place]  [employer] [place] [facility] 1) [address], [place] °2) [address], [place] °3)  [address], [place] (336) 349-3220 °(336) 342-7768 ° °(336) 342-8140   °[place] Child Abuse [facility] (336) 342-1394 or (336) 342-3537 (After Hours)    ° ° °

## 2014-01-18 NOTE — Progress Notes (Signed)
[facility] [employer] Specialist Stacy,   Provided pt with a list of primary care resources, [facility] [employer] [facility] application, and [facility] information. Patient stated interest in [facility] but declined offer for me to schedule appt with [facility].

## 2014-12-30 ENCOUNTER — Other Ambulatory Visit: Payer: Self-pay

## 2014-12-30 ENCOUNTER — Other Ambulatory Visit: Payer: Self-pay | Admitting: Physician Assistant

## 2014-12-30 ENCOUNTER — Other Ambulatory Visit (HOSPITAL_COMMUNITY)
Admission: RE | Admit: 2014-12-30 | Discharge: 2014-12-30 | Disposition: A | Discharge status: Home / Self Care | Payer: Managed Care, Other (non HMO) | Source: Ambulatory Visit | Attending: Physician Assistant | Admitting: Physician Assistant

## 2014-12-30 DIAGNOSIS — Z1151 Encounter for screening for human papillomavirus (HPV): Secondary | ICD-10-CM | POA: Diagnosis present

## 2014-12-30 DIAGNOSIS — Z1231 Encounter for screening mammogram for malignant neoplasm of breast: Secondary | ICD-10-CM

## 2014-12-30 DIAGNOSIS — Z124 Encounter for screening for malignant neoplasm of cervix: Secondary | ICD-10-CM | POA: Diagnosis present

## 2015-01-03 LAB — CYTOLOGY - PAP

## 2015-02-06 ENCOUNTER — Ambulatory Visit: Payer: Self-pay

## 2015-03-12 HISTORY — PX: COLONOSCOPY: SHX174

## 2015-03-14 ENCOUNTER — Ambulatory Visit
Admission: RE | Admit: 2015-03-14 | Discharge: 2015-03-14 | Disposition: A | Discharge status: Home / Self Care | Payer: Managed Care, Other (non HMO) | Source: Ambulatory Visit

## 2015-03-14 DIAGNOSIS — Z1231 Encounter for screening mammogram for malignant neoplasm of breast: Secondary | ICD-10-CM

## 2015-05-11 ENCOUNTER — Other Ambulatory Visit (HOSPITAL_COMMUNITY): Payer: Self-pay | Admitting: Family Medicine

## 2015-05-11 DIAGNOSIS — N950 Postmenopausal bleeding: Secondary | ICD-10-CM

## 2015-05-22 ENCOUNTER — Ambulatory Visit (HOSPITAL_COMMUNITY)
Admission: RE | Admit: 2015-05-22 | Discharge: 2015-05-22 | Disposition: A | Discharge status: Home / Self Care | Payer: Managed Care, Other (non HMO) | Source: Ambulatory Visit | Attending: Family Medicine | Admitting: Family Medicine

## 2015-05-22 DIAGNOSIS — N950 Postmenopausal bleeding: Secondary | ICD-10-CM | POA: Diagnosis present

## 2015-06-01 ENCOUNTER — Other Ambulatory Visit: Payer: Self-pay | Admitting: Obstetrics & Gynecology

## 2015-06-10 HISTORY — PX: KNEE ARTHROSCOPY: SUR90

## 2015-06-22 NOTE — Patient Instructions (Signed)
Your procedure is scheduled on: Wednesday, May 10  [facility] at: 7[facility]  Pick up the phone at the desk and dial [phone].  Call this number if you have problems the morning of surgery: [phone].  Remember: Do NOT eat or drink after:  Midnight Tuesday, 5/9 Take these medicines the morning of surgery with a SIP OF WATER:  lisinopril  Do NOT wear jewelry (body piercing), metal hair clips/bobby pins, make-up, or nail polish. Do NOT wear lotions, powders, or perfumes.  You may wear deoderant. Do NOT shave for 48 hours prior to surgery. Do NOT bring valuables to the hospital. Contacts, dentures, or bridgework may not be worn into surgery.  Have a responsible adult drive you home and stay with you for 24 hours after your procedure

## 2015-06-23 ENCOUNTER — Inpatient Hospital Stay (HOSPITAL_COMMUNITY)
Admission: RE | Admit: 2015-06-23 | Discharge: 2015-06-23 | Disposition: A | Discharge status: Home / Self Care | Payer: Managed Care, Other (non HMO) | Source: Ambulatory Visit

## 2015-07-12 NOTE — H&P (Signed)
GYN H&P  50yo PM female who presents for hysteroscopy, D&C, possible polypectomy due to postmenopausal bleeding. On 05/07/15, she had a "regular" period that lasted for 6 days after not having a period for several years.  The bleeding required at least 4-5 pads per days with passage of clots. Prior to this, she had not had a period for over 5 years. Denies significant pelvic or abdominal pain. Reports no other acute complaints.  US on 05/22/15: 6.8cm uterus with 6-298mm endometrial stripe. Normal ovaries bilaterally.  EMB was attempted in office- unable to performed due to cervical stenosis  Current Medications  Taking   Meloxicam 15 MG Tablet 1 tablet Orally Once a day   Lisinopril 10 MG Tablet 1 tablet by mouth Once a day, Notes: MCNEILL   Hydrocodone-Acetaminophen            Past Medical History  Hypertension.    Surgical History  carpal tunnel(L) 2006  Colonoscopy 02/2015   Family History  Father: alive 6670 yrs, health  Mother: alive 2270 yrs, health  Paternal Grand Father: deceased  Paternal Grand Mother: deceased  Maternal Grand Father: deceased, diagnosed with Colon Ca  Maternal Grand Mother: deceased  cousin on mothers side with colon cancer, denies any GYN family cancer hx.   Social History  General:  Tobacco use  cigarettes: Current smoker Frequency: 1/2 PPD Tobacco history last updated 06/01/2015 EXPOSURE TO PASSIVE SMOKE: 1 ppd.  Alcohol: yes, 1-2 per week, beer.  Caffeine: 2 Red Bull a day.  no Recreational drug use.  DIET: no particular dietary program.  Exercise: nothing structured.  Marital Status: Single.  Children: 2.  OCCUPATION: employed, Engineering geologistscheduling secretary.    Gyn History  Sexual activity currently sexually active.  Periods : postmenopausal.  LMP had some bleeding on 05/07/2015 for about 5 days.  Denies H/O Birth control.  Last pap smear date 12/30/2014 Negative.  Last mammogram date 03/2015 .  Denies H/O Abnormal pap smear.  Denies H/O STD.     OB History  Pregnancy # 1 live birth, vaginal delivery.  Pregnancy # 2 live birth, vaginal delivery.    Allergies  N.K.D.A.   Hospitalization/Major Diagnostic Procedure  childbirth    Review of Systems  CONSTITUTIONAL:  no Chills. no Fever. no Night sweats.  HEENT:  Blurrred vision no. no Double vision.  CARDIOLOGY:  no Chest pain.  UROLOGY:  no Urinary frequency. no Urinary incontinence. no Urinary urgency.  RESPIRATORY:  no Shortness of breath. no Cough.  GASTROENTEROLOGY:  no Abdominal pain. no Appetite change. no Change in bowel movements.  FEMALE REPRODUCTIVE:  no Breast lumps or discharge. no Breast pain. no Hot flashes. no Unusual vaginal discharge. no Vaginal irritation. no Vaginal itching.  NEUROLOGY:  no Dizziness. no Headache. no Loss of consciousness.  PSYCHOLOGY:  no Anxiety. no Depression. no Confusion.  SKIN:  no Rash. no Hives.  HEMATOLOGY/LYMPH:  no Anemia. no Fatigue. Using Blood Thinners no.     O: Performed in office: General Examination: GENERAL APPEARANCE well developed, well nourished .  SKIN: warm and dry, no rashes .  NECK: supple, normal appearance .  LUNGS: clear to auscultation bilaterally, no wheezes, rhonchi, rales.  HEART: no murmurs, regular rate and rhythm.  ABDOMEN: soft and not tender, no masses palpated, no rebound, no rigidity.  FEMALE GENITOURINARY: normal external genitalia, labia - unremarkable, vagina - pink moist mucosa, no lesions or abnormal discharge, cervix - no discharge or lesions or CMT, adnexa - no masses or tenderness, uterus -  nontender and normal size on palpation.  MUSCULOSKELETAL no calf tenderness bilaterally .  EXTREMITIES: no edema present .  PSYCH appropriate mood and affect .   A/P: 50yo postmenopausal female who presents for hysteroscopy, D&C, possible polypectomy due to postmenopausal bleeding and cervical stenosis. -NPO -LR @ 125cc/hr -SCDs to OR -Risk/benefit and indications reviewed with patient.   Concerns and questions were addressed and pt wishes to proceed.  Brandy Hidalgo, DO [phone] (pager) [phone] (office)

## 2015-07-17 NOTE — Patient Instructions (Signed)
Your procedure is scheduled on:  Wednesday, Jul 19, 2015  [facility] at:  7:00 AM  Pick up the phone at the desk and dial [phone].  Call this number if you have problems the morning of surgery: [phone].  Remember:  Do NOT eat food or drink after:  Midnight Tonight  Take these medicines the morning of surgery with a SIP OF WATER: Lisinopril  Do NOT wear jewelry (body piercing), metal hair clips/bobby pins, make-up, or nail polish. Do NOT wear lotions, powders, or perfumes.  You may wear deodorant. Do NOT shave for 48 hours prior to surgery. Do NOT bring valuables to the hospital. Contacts, dentures, or bridgework may not be worn into surgery.  Have a responsible adult drive you home and stay with you for 24 hours after your procedure

## 2015-07-18 ENCOUNTER — Other Ambulatory Visit: Payer: Self-pay

## 2015-07-18 ENCOUNTER — Encounter (HOSPITAL_COMMUNITY)
Admission: RE | Admit: 2015-07-18 | Discharge: 2015-07-18 | Disposition: A | Discharge status: Home / Self Care | Payer: Managed Care, Other (non HMO) | Source: Ambulatory Visit | Attending: Obstetrics & Gynecology | Admitting: Obstetrics & Gynecology

## 2015-07-18 ENCOUNTER — Encounter (HOSPITAL_COMMUNITY): Payer: Self-pay

## 2015-07-18 HISTORY — DX: Essential (primary) hypertension: I10

## 2015-07-18 HISTORY — DX: Unspecified osteoarthritis, unspecified site: M19.90

## 2015-07-18 LAB — COMPREHENSIVE METABOLIC PANEL
ALBUMIN: 4.3 g/dL (ref 3.5–5.0)
ALT: 17 U/L (ref 14–54)
AST: 21 U/L (ref 15–41)
Alkaline Phosphatase: 61 U/L (ref 38–126)
Anion gap: 8 (ref 5–15)
BUN: 14 mg/dL (ref 6–20)
CHLORIDE: 104 mmol/L (ref 101–111)
CO2: 27 mmol/L (ref 22–32)
Calcium: 9.3 mg/dL (ref 8.9–10.3)
Creatinine, Ser: 0.75 mg/dL (ref 0.44–1.00)
GFR calc Af Amer: >60 mL/min (ref >60)
GFR calc non Af Amer: >60 mL/min (ref >60)
Glucose, Bld: 103 mg/dL — ABNORMAL HIGH (ref 65–99)
POTASSIUM: 5 mmol/L (ref 3.5–5.1)
SODIUM: 139 mmol/L (ref 135–145)
Total Bilirubin: 0.1 mg/dL — ABNORMAL LOW (ref 0.3–1.2)
Total Protein: 7 g/dL (ref 6.5–8.1)

## 2015-07-18 LAB — CBC
HCT: 39.3 % (ref 36.0–46.0)
Hemoglobin: 13.9 g/dL (ref 12.0–15.0)
MCH: 31.8 pg (ref 26.0–34.0)
MCHC: 35.4 g/dL (ref 30.0–36.0)
MCV: 89.9 fL (ref 78.0–100.0)
PLATELETS: 228 10*3/uL (ref 150–400)
RBC: 4.37 MIL/uL (ref 3.87–5.11)
RDW: 13.3 % (ref 11.5–15.5)
WBC: 6.4 10*3/uL (ref 4.0–10.5)

## 2015-07-18 NOTE — Anesthesia Preprocedure Evaluation (Signed)
Anesthesia Evaluation  Patient identified by MRN, date of birth, ID band Patient awake    Reviewed: Allergy & Precautions, NPO status , Patient's Chart, lab work & pertinent test results  History of Anesthesia Complications Negative for: history of anesthetic complications  Airway Mallampati: II  TM Distance: >3 FB Neck ROM: Full    Dental no notable dental hx. (+) Dental Advisory Given   Pulmonary Current Smoker,    Pulmonary exam normal breath sounds clear to auscultation       Cardiovascular hypertension, Pt. on medications Normal cardiovascular exam Rhythm:Regular Rate:Normal     Neuro/Psych PSYCHIATRIC DISORDERS Anxiety Depression negative neurological ROS     GI/Hepatic negative GI ROS, Neg liver ROS,   Endo/Other  negative endocrine ROS  Renal/GU negative Renal ROS  negative genitourinary   Musculoskeletal  (+) Arthritis , Osteoarthritis,    Abdominal   Peds negative pediatric ROS (+)  Hematology negative hematology ROS (+)   Anesthesia Other Findings   Reproductive/Obstetrics negative OB ROS                             Anesthesia Physical Anesthesia Plan  ASA: II  Anesthesia Plan: General   Post-op Pain Management:    Induction: Intravenous  Airway Management Planned: LMA  Additional Equipment:   Intra-op Plan:   Post-operative Plan: Extubation in OR  Informed Consent: I have reviewed the patients History and Physical, chart, labs and discussed the procedure including the risks, benefits and alternatives for the proposed anesthesia with the patient or authorized representative who has indicated his/her understanding and acceptance.   Dental advisory given  Plan Discussed with: CRNA  Anesthesia Plan Comments:         Anesthesia Quick Evaluation

## 2015-07-19 ENCOUNTER — Encounter (HOSPITAL_COMMUNITY): Admission: AD | Disposition: A | Discharge status: Home / Self Care | Payer: Self-pay | Source: Ambulatory Visit

## 2015-07-19 ENCOUNTER — Inpatient Hospital Stay (HOSPITAL_COMMUNITY)
Admission: AD | Admit: 2015-07-19 | Discharge: 2015-07-25 | DRG: 907 | Disposition: A | Discharge status: Home / Self Care | Payer: Managed Care, Other (non HMO) | Source: Ambulatory Visit | Attending: General Surgery | Admitting: General Surgery

## 2015-07-19 ENCOUNTER — Encounter (HOSPITAL_COMMUNITY): Payer: Self-pay

## 2015-07-19 ENCOUNTER — Ambulatory Visit (HOSPITAL_COMMUNITY): Payer: Managed Care, Other (non HMO) | Admitting: Certified Registered Nurse Anesthetist

## 2015-07-19 ENCOUNTER — Encounter (HOSPITAL_COMMUNITY): Admission: RE | Payer: Self-pay | Source: Ambulatory Visit

## 2015-07-19 ENCOUNTER — Ambulatory Visit (HOSPITAL_COMMUNITY): Payer: Managed Care, Other (non HMO) | Admitting: Anesthesiology

## 2015-07-19 ENCOUNTER — Inpatient Hospital Stay (HOSPITAL_COMMUNITY): Payer: Managed Care, Other (non HMO)

## 2015-07-19 ENCOUNTER — Ambulatory Visit (HOSPITAL_COMMUNITY): Payer: Managed Care, Other (non HMO)

## 2015-07-19 ENCOUNTER — Ambulatory Visit (HOSPITAL_COMMUNITY)
Admission: RE | Admit: 2015-07-19 | Payer: Managed Care, Other (non HMO) | Source: Ambulatory Visit | Admitting: Obstetrics & Gynecology

## 2015-07-19 DIAGNOSIS — N950 Postmenopausal bleeding: Secondary | ICD-10-CM | POA: Diagnosis present

## 2015-07-19 DIAGNOSIS — N9971 Accidental puncture and laceration of a genitourinary system organ or structure during a genitourinary system procedure: Principal | ICD-10-CM | POA: Diagnosis present

## 2015-07-19 DIAGNOSIS — K5900 Constipation, unspecified: Secondary | ICD-10-CM | POA: Diagnosis not present

## 2015-07-19 DIAGNOSIS — K661 Hemoperitoneum: Secondary | ICD-10-CM | POA: Diagnosis present

## 2015-07-19 DIAGNOSIS — N882 Stricture and stenosis of cervix uteri: Secondary | ICD-10-CM | POA: Diagnosis present

## 2015-07-19 DIAGNOSIS — I959 Hypotension, unspecified: Secondary | ICD-10-CM | POA: Diagnosis present

## 2015-07-19 DIAGNOSIS — K9162 Intraoperative hemorrhage and hematoma of a digestive system organ or structure complicating other procedure: Secondary | ICD-10-CM | POA: Diagnosis present

## 2015-07-19 DIAGNOSIS — I10 Essential (primary) hypertension: Secondary | ICD-10-CM | POA: Diagnosis present

## 2015-07-19 DIAGNOSIS — Y760 Diagnostic and monitoring obstetric and gynecological devices associated with adverse incidents: Secondary | ICD-10-CM | POA: Diagnosis present

## 2015-07-19 DIAGNOSIS — D62 Acute posthemorrhagic anemia: Secondary | ICD-10-CM | POA: Diagnosis present

## 2015-07-19 DIAGNOSIS — Z09 Encounter for follow-up examination after completed treatment for conditions other than malignant neoplasm: Secondary | ICD-10-CM

## 2015-07-19 DIAGNOSIS — Z452 Encounter for adjustment and management of vascular access device: Secondary | ICD-10-CM

## 2015-07-19 DIAGNOSIS — F17210 Nicotine dependence, cigarettes, uncomplicated: Secondary | ICD-10-CM | POA: Diagnosis present

## 2015-07-19 HISTORY — DX: Tubal ligation status: Z98.51

## 2015-07-19 HISTORY — PX: LAPAROSCOPY: SHX197

## 2015-07-19 HISTORY — PX: DILATATION & CURETTAGE/HYSTEROSCOPY WITH MYOSURE: SHX6511

## 2015-07-19 LAB — POCT I-STAT 7, (LYTES, BLD GAS, ICA,H+H)
ACID-BASE DEFICIT: 2 mmol/L (ref 0.0–2.0)
Acid-base deficit: 4 mmol/L — ABNORMAL HIGH (ref 0.0–2.0)
BICARBONATE: 23.9 meq/L (ref 20.0–24.0)
Bicarbonate: 21.9 mEq/L (ref 20.0–24.0)
CALCIUM ION: 1.14 mmol/L (ref 1.12–1.23)
CALCIUM ION: 1.09 mmol/L — AB (ref 1.12–1.23)
HCT: 22 % — ABNORMAL LOW (ref 36.0–46.0)
HEMATOCRIT: 26 % — AB (ref 36.0–46.0)
HEMOGLOBIN: 8.8 g/dL — AB (ref 12.0–15.0)
Hemoglobin: 7.5 g/dL — ABNORMAL LOW (ref 12.0–15.0)
O2 SAT: 100 %
O2 Saturation: 100 %
PH ART: 7.32 — AB (ref 7.350–7.450)
POTASSIUM: 4.1 mmol/L (ref 3.5–5.1)
Patient temperature: 36
Potassium: 4.3 mmol/L (ref 3.5–5.1)
SODIUM: 138 mmol/L (ref 135–145)
SODIUM: 138 mmol/L (ref 135–145)
TCO2: 25 mmol/L (ref 0–100)
TCO2: 23 mmol/L (ref 0–100)
pCO2 arterial: 46.4 mmHg — ABNORMAL HIGH (ref 35.0–45.0)
pCO2 arterial: 40.6 mmHg (ref 35.0–45.0)
pH, Arterial: 7.334 — ABNORMAL LOW (ref 7.350–7.450)
pO2, Arterial: 483 mmHg — ABNORMAL HIGH (ref 80.0–100.0)
pO2, Arterial: 436 mmHg — ABNORMAL HIGH (ref 80.0–100.0)

## 2015-07-19 LAB — CBC
HCT: 30.2 % — ABNORMAL LOW (ref 36.0–46.0)
HEMATOCRIT: 29.2 % — AB (ref 36.0–46.0)
HEMOGLOBIN: 10.5 g/dL — AB (ref 12.0–15.0)
Hemoglobin: 10.1 g/dL — ABNORMAL LOW (ref 12.0–15.0)
MCH: 31.5 pg (ref 26.0–34.0)
MCH: 31.3 pg (ref 26.0–34.0)
MCHC: 34.6 g/dL (ref 30.0–36.0)
MCHC: 34.8 g/dL (ref 30.0–36.0)
MCV: 91 fL (ref 78.0–100.0)
MCV: 90.1 fL (ref 78.0–100.0)
Platelets: 207 10*3/uL (ref 150–400)
Platelets: 172 10*3/uL (ref 150–400)
RBC: 3.21 MIL/uL — ABNORMAL LOW (ref 3.87–5.11)
RBC: 3.35 MIL/uL — ABNORMAL LOW (ref 3.87–5.11)
RDW: 13.4 % (ref 11.5–15.5)
RDW: 13.9 % (ref 11.5–15.5)
WBC: 5.3 10*3/uL (ref 4.0–10.5)
WBC: 14.3 10*3/uL — ABNORMAL HIGH (ref 4.0–10.5)

## 2015-07-19 LAB — COMPREHENSIVE METABOLIC PANEL
ALT: 12 U/L — ABNORMAL LOW (ref 14–54)
ANION GAP: 5 (ref 5–15)
AST: 16 U/L (ref 15–41)
Albumin: 3.2 g/dL — ABNORMAL LOW (ref 3.5–5.0)
Alkaline Phosphatase: 43 U/L (ref 38–126)
BILIRUBIN TOTAL: 0.2 mg/dL — AB (ref 0.3–1.2)
BUN: 11 mg/dL (ref 6–20)
CO2: 25 mmol/L (ref 22–32)
Calcium: 8.2 mg/dL — ABNORMAL LOW (ref 8.9–10.3)
Chloride: 110 mmol/L (ref 101–111)
Creatinine, Ser: 0.68 mg/dL (ref 0.44–1.00)
Glucose, Bld: 109 mg/dL — ABNORMAL HIGH (ref 65–99)
POTASSIUM: 4.2 mmol/L (ref 3.5–5.1)
Sodium: 140 mmol/L (ref 135–145)
TOTAL PROTEIN: 5.1 g/dL — AB (ref 6.5–8.1)

## 2015-07-19 LAB — ABO/RH: ABO/RH(D): O POS

## 2015-07-19 LAB — GLUCOSE, CAPILLARY: GLUCOSE-CAPILLARY: 137 mg/dL — AB (ref 65–99)

## 2015-07-19 SURGERY — DILATATION & CURETTAGE/HYSTEROSCOPY WITH MYOSURE
Anesthesia: Choice

## 2015-07-19 SURGERY — LAPAROSCOPY, DIAGNOSTIC
Anesthesia: General | Site: Abdomen

## 2015-07-19 SURGERY — DILATATION & CURETTAGE/HYSTEROSCOPY WITH MYOSURE
Anesthesia: General | Site: Uterus

## 2015-07-19 SURGERY — LAPAROSCOPY, DIAGNOSTIC
Anesthesia: Choice

## 2015-07-19 MED ORDER — FENTANYL CITRATE (PF) 100 MCG/2ML IJ SOLN
INTRAMUSCULAR | Status: AC
  Administered 2015-07-19: 50 ug via INTRAVENOUS
  Filled 2015-07-19: qty 2

## 2015-07-19 MED ORDER — FENTANYL CITRATE (PF) 100 MCG/2ML IJ SOLN
25.0000 ug | INTRAMUSCULAR | Status: DC | PRN
  Administered 2015-07-19 (×3): 50 ug via INTRAVENOUS

## 2015-07-19 MED ORDER — LACTATED RINGERS IV SOLN
INTRAVENOUS | Status: DC | PRN
  Administered 2015-07-19 (×2): via INTRAVENOUS

## 2015-07-19 MED ORDER — MIDAZOLAM HCL 2 MG/2ML IJ SOLN
INTRAMUSCULAR | Status: DC | PRN
  Administered 2015-07-19 (×2): 1 mg via INTRAVENOUS

## 2015-07-19 MED ORDER — SCOPOLAMINE 1 MG/3DAYS TD PT72
1.0000 | MEDICATED_PATCH | Freq: Once | TRANSDERMAL | Status: DC
  Administered 2015-07-19: 1.5 mg via TRANSDERMAL

## 2015-07-19 MED ORDER — CEFAZOLIN SODIUM 1 G IJ SOLR
INTRAMUSCULAR | Status: AC
  Filled 2015-07-19: qty 20

## 2015-07-19 MED ORDER — PHENYLEPHRINE HCL 10 MG/ML IJ SOLN
INTRAMUSCULAR | Status: DC | PRN
  Administered 2015-07-19: 160 ug via INTRAVENOUS

## 2015-07-19 MED ORDER — FENTANYL CITRATE (PF) 250 MCG/5ML IJ SOLN
INTRAMUSCULAR | Status: AC
  Filled 2015-07-19: qty 10

## 2015-07-19 MED ORDER — CEFAZOLIN SODIUM-DEXTROSE 2-3 GM-% IV SOLR
INTRAVENOUS | Status: DC | PRN
  Administered 2015-07-19 (×2): 2 g via INTRAVENOUS

## 2015-07-19 MED ORDER — PHENYLEPHRINE HCL 10 MG/ML IJ SOLN
0.0000 ug/min | INTRAVENOUS | Status: AC
  Administered 2015-07-19: 60 ug/min via INTRAVENOUS
  Filled 2015-07-19 (×2): qty 2

## 2015-07-19 MED ORDER — MIDAZOLAM HCL 5 MG/5ML IJ SOLN
INTRAMUSCULAR | Status: DC | PRN
  Administered 2015-07-19: 2 mg via INTRAVENOUS

## 2015-07-19 MED ORDER — ONDANSETRON HCL 4 MG/2ML IJ SOLN
INTRAMUSCULAR | Status: DC | PRN
  Administered 2015-07-19: 4 mg via INTRAVENOUS

## 2015-07-19 MED ORDER — PHENYLEPHRINE HCL 10 MG/ML IJ SOLN
INTRAMUSCULAR | Status: DC | PRN
  Administered 2015-07-19 (×3): 40 ug via INTRAVENOUS

## 2015-07-19 MED ORDER — ONDANSETRON HCL 4 MG/2ML IJ SOLN
4.0000 mg | Freq: Four times a day (QID) | INTRAMUSCULAR | Status: DC | PRN
  Administered 2015-07-20: 4 mg via INTRAVENOUS
  Filled 2015-07-19: qty 2

## 2015-07-19 MED ORDER — LACTATED RINGERS IV SOLN: INTRAVENOUS | Status: DC

## 2015-07-19 MED ORDER — FENTANYL CITRATE (PF) 100 MCG/2ML IJ SOLN
INTRAMUSCULAR | Status: DC | PRN
  Administered 2015-07-19: 50 ug via INTRAVENOUS
  Administered 2015-07-19: 25 ug via INTRAVENOUS
  Administered 2015-07-19: 50 ug via INTRAVENOUS
  Administered 2015-07-19 (×3): 25 ug via INTRAVENOUS

## 2015-07-19 MED ORDER — LACTATED RINGERS IV SOLN
INTRAVENOUS | Status: DC
  Administered 2015-07-19: 08:00:00 via INTRAVENOUS

## 2015-07-19 MED ORDER — SUGAMMADEX SODIUM 200 MG/2ML IV SOLN
INTRAVENOUS | Status: DC | PRN
  Administered 2015-07-19: 200 mg via INTRAVENOUS

## 2015-07-19 MED ORDER — HYDROMORPHONE HCL 1 MG/ML IJ SOLN: 1.0000 mg | INTRAMUSCULAR | Status: DC | PRN

## 2015-07-19 MED ORDER — OXYCODONE HCL 5 MG PO TABS: 5.0000 mg | ORAL_TABLET | Freq: Once | ORAL | Status: DC | PRN

## 2015-07-19 MED ORDER — FENTANYL CITRATE (PF) 100 MCG/2ML IJ SOLN
25.0000 ug | INTRAMUSCULAR | Status: DC | PRN
  Administered 2015-07-19: 50 ug via INTRAVENOUS

## 2015-07-19 MED ORDER — CEFAZOLIN SODIUM-DEXTROSE 2-4 GM/100ML-% IV SOLN
2.0000 g | INTRAVENOUS | Status: DC
  Filled 2015-07-19 (×2): qty 100

## 2015-07-19 MED ORDER — DIPHENHYDRAMINE HCL 12.5 MG/5ML PO ELIX
12.5000 mg | ORAL_SOLUTION | Freq: Four times a day (QID) | ORAL | Status: DC | PRN
  Filled 2015-07-19: qty 5

## 2015-07-19 MED ORDER — MORPHINE SULFATE 2 MG/ML IV SOLN
INTRAVENOUS | Status: AC
  Filled 2015-07-19: qty 25

## 2015-07-19 MED ORDER — PHENYLEPHRINE 40 MCG/ML (10ML) SYRINGE FOR IV PUSH (FOR BLOOD PRESSURE SUPPORT)
120.0000 ug | PREFILLED_SYRINGE | Freq: Once | INTRAVENOUS | Status: AC
  Administered 2015-07-19: 120 ug via INTRAVENOUS
  Filled 2015-07-19: qty 5

## 2015-07-19 MED ORDER — LIDOCAINE HCL (CARDIAC) 20 MG/ML IV SOLN
INTRAVENOUS | Status: DC | PRN
  Administered 2015-07-19: 80 mg via INTRAVENOUS

## 2015-07-19 MED ORDER — MIDAZOLAM HCL 2 MG/2ML IJ SOLN
INTRAMUSCULAR | Status: AC
  Filled 2015-07-19: qty 2

## 2015-07-19 MED ORDER — ONDANSETRON HCL 4 MG/2ML IJ SOLN: 4.0000 mg | Freq: Once | INTRAMUSCULAR | Status: DC | PRN

## 2015-07-19 MED ORDER — DIPHENHYDRAMINE HCL 50 MG/ML IJ SOLN: 12.5000 mg | Freq: Four times a day (QID) | INTRAMUSCULAR | Status: DC | PRN

## 2015-07-19 MED ORDER — DIPHENHYDRAMINE HCL 50 MG/ML IJ SOLN
INTRAMUSCULAR | Status: AC
  Administered 2015-07-19: 12.5 mg via INTRAVENOUS
  Filled 2015-07-19: qty 1

## 2015-07-19 MED ORDER — ONDANSETRON HCL 4 MG/2ML IJ SOLN: 4.0000 mg | Freq: Four times a day (QID) | INTRAMUSCULAR | Status: DC | PRN

## 2015-07-19 MED ORDER — NALOXONE HCL 0.4 MG/ML IJ SOLN: 0.4000 mg | INTRAMUSCULAR | Status: DC | PRN

## 2015-07-19 MED ORDER — ONDANSETRON 4 MG PO TBDP
4.0000 mg | ORAL_TABLET | Freq: Four times a day (QID) | ORAL | Status: DC | PRN
  Filled 2015-07-19: qty 1

## 2015-07-19 MED ORDER — PROPOFOL 10 MG/ML IV BOLUS
INTRAVENOUS | Status: AC
  Filled 2015-07-19: qty 20

## 2015-07-19 MED ORDER — LACTATED RINGERS IV SOLN
INTRAVENOUS | Status: DC
  Administered 2015-07-19: 09:00:00 via INTRAVENOUS
  Administered 2015-07-19: 1000 mL/h via INTRAVENOUS
  Administered 2015-07-19: 08:00:00 via INTRAVENOUS

## 2015-07-19 MED ORDER — 0.9 % SODIUM CHLORIDE (POUR BTL) OPTIME
TOPICAL | Status: DC | PRN
  Administered 2015-07-19: 4000 mL
  Administered 2015-07-19: 8000 mL

## 2015-07-19 MED ORDER — FENTANYL CITRATE (PF) 250 MCG/5ML IJ SOLN
INTRAMUSCULAR | Status: DC | PRN
  Administered 2015-07-19: 100 ug via INTRAVENOUS
  Administered 2015-07-19: 50 ug via INTRAVENOUS
  Administered 2015-07-19: 100 ug via INTRAVENOUS
  Administered 2015-07-19: 50 ug via INTRAVENOUS
  Administered 2015-07-19 (×3): 100 ug via INTRAVENOUS

## 2015-07-19 MED ORDER — SCOPOLAMINE 1 MG/3DAYS TD PT72
MEDICATED_PATCH | TRANSDERMAL | Status: AC
Start: 2015-07-19 — End: 2015-07-19
  Filled 2015-07-19: qty 1

## 2015-07-19 MED ORDER — POTASSIUM CHLORIDE IN NACL 20-0.9 MEQ/L-% IV SOLN
INTRAVENOUS | Status: DC
  Administered 2015-07-19 – 2015-07-24 (×10): via INTRAVENOUS
  Filled 2015-07-19 (×13): qty 1000

## 2015-07-19 MED ORDER — DIPHENHYDRAMINE HCL 12.5 MG/5ML PO ELIX: 12.5000 mg | ORAL_SOLUTION | Freq: Four times a day (QID) | ORAL | Status: DC | PRN

## 2015-07-19 MED ORDER — ETOMIDATE 2 MG/ML IV SOLN
INTRAVENOUS | Status: DC | PRN
  Administered 2015-07-19: 1 mg via INTRAVENOUS

## 2015-07-19 MED ORDER — DIPHENHYDRAMINE HCL 50 MG/ML IJ SOLN
12.5000 mg | Freq: Four times a day (QID) | INTRAMUSCULAR | Status: DC | PRN
  Administered 2015-07-19: 12.5 mg via INTRAVENOUS

## 2015-07-19 MED ORDER — FENTANYL CITRATE (PF) 250 MCG/5ML IJ SOLN
INTRAMUSCULAR | Status: AC
  Filled 2015-07-19: qty 5

## 2015-07-19 MED ORDER — GLYCOPYRROLATE 0.2 MG/ML IJ SOLN
INTRAMUSCULAR | Status: DC | PRN
  Administered 2015-07-19 (×2): 0.1 mg via INTRAVENOUS

## 2015-07-19 MED ORDER — PANTOPRAZOLE SODIUM 40 MG IV SOLR
40.0000 mg | Freq: Every day | INTRAVENOUS | Status: DC
  Administered 2015-07-20 – 2015-07-21 (×3): 40 mg via INTRAVENOUS
  Filled 2015-07-19 (×2): qty 40

## 2015-07-19 MED ORDER — SODIUM CHLORIDE 0.9 % IV SOLN
0.0300 [IU]/min | INTRAVENOUS | Status: DC
  Filled 2015-07-19: qty 2

## 2015-07-19 MED ORDER — ROCURONIUM BROMIDE 100 MG/10ML IV SOLN
INTRAVENOUS | Status: DC | PRN
  Administered 2015-07-19 (×2): 50 mg via INTRAVENOUS

## 2015-07-19 MED ORDER — SUCCINYLCHOLINE CHLORIDE 20 MG/ML IJ SOLN
INTRAMUSCULAR | Status: DC | PRN
  Administered 2015-07-19: 150 mg via INTRAVENOUS

## 2015-07-19 MED ORDER — MORPHINE SULFATE 2 MG/ML IV SOLN
INTRAVENOUS | Status: DC
  Administered 2015-07-19: 15:00:00 via INTRAVENOUS
  Administered 2015-07-19: 7.5 mg via INTRAVENOUS
  Administered 2015-07-20: 16.5 mg via INTRAVENOUS
  Administered 2015-07-20: 18:00:00 via INTRAVENOUS
  Administered 2015-07-20: 6 mg via INTRAVENOUS
  Administered 2015-07-20: 22 mg via INTRAVENOUS
  Administered 2015-07-20: 33 mg via INTRAVENOUS
  Administered 2015-07-20 – 2015-07-21 (×2): via INTRAVENOUS
  Administered 2015-07-21: 7.5 mg via INTRAVENOUS
  Administered 2015-07-21: 15 mg via INTRAVENOUS
  Administered 2015-07-21: 13.5 mg via INTRAVENOUS
  Administered 2015-07-21: 18 mg via INTRAVENOUS
  Administered 2015-07-21: 10:00:00 via INTRAVENOUS
  Administered 2015-07-21: 10.5 mg via INTRAVENOUS
  Administered 2015-07-22: 9 mg via INTRAVENOUS
  Administered 2015-07-22: 4.5 mg via INTRAVENOUS
  Administered 2015-07-22: 1.9 mg via INTRAVENOUS
  Filled 2015-07-19 (×4): qty 25

## 2015-07-19 MED ORDER — SODIUM CHLORIDE 0.9% FLUSH: 9.0000 mL | INTRAVENOUS | Status: DC | PRN

## 2015-07-19 MED ORDER — PHENYLEPHRINE 40 MCG/ML (10ML) SYRINGE FOR IV PUSH (FOR BLOOD PRESSURE SUPPORT)
80.0000 ug | PREFILLED_SYRINGE | Freq: Once | INTRAVENOUS | Status: AC
  Administered 2015-07-19: 80 ug via INTRAVENOUS
  Filled 2015-07-19: qty 5

## 2015-07-19 MED ORDER — BUPIVACAINE HCL (PF) 0.25 % IJ SOLN
INTRAMUSCULAR | Status: DC | PRN
  Administered 2015-07-19: 30 mL

## 2015-07-19 MED ORDER — DEXAMETHASONE SODIUM PHOSPHATE 10 MG/ML IJ SOLN
INTRAMUSCULAR | Status: DC | PRN
  Administered 2015-07-19: 8 mg via INTRAVENOUS

## 2015-07-19 MED ORDER — LIDOCAINE HCL (CARDIAC) 20 MG/ML IV SOLN
INTRAVENOUS | Status: DC | PRN
  Administered 2015-07-19: 40 mg via INTRAVENOUS

## 2015-07-19 MED ORDER — KETOROLAC TROMETHAMINE 30 MG/ML IJ SOLN
INTRAMUSCULAR | Status: DC | PRN
  Administered 2015-07-19: 30 mg via INTRAVENOUS

## 2015-07-19 MED ORDER — PROPOFOL 10 MG/ML IV BOLUS
INTRAVENOUS | Status: DC | PRN
  Administered 2015-07-19: 120 mg via INTRAVENOUS

## 2015-07-19 MED ORDER — OXYCODONE HCL 5 MG/5ML PO SOLN: 5.0000 mg | Freq: Once | ORAL | Status: DC | PRN

## 2015-07-19 MED ORDER — ALBUMIN HUMAN 5 % IV SOLN
12.5000 g | Freq: Once | INTRAVENOUS | Status: AC
  Administered 2015-07-19: 12.5 g via INTRAVENOUS
  Filled 2015-07-19: qty 250

## 2015-07-19 MED ORDER — SODIUM CHLORIDE 0.9 % IV SOLN
INTRAVENOUS | Status: DC | PRN
  Administered 2015-07-19: 12:00:00 via INTRAVENOUS

## 2015-07-19 SURGICAL SUPPLY — 39 items
ADH SKN CLS LQ APL DERMABOND (GAUZE/BANDAGES/DRESSINGS) ×1
CANISTER SUCT 3000ML PPV (MISCELLANEOUS) ×8 IMPLANT
DERMABOND ADHESIVE PROPEN (GAUZE/BANDAGES/DRESSINGS) ×2
DERMABOND ADVANCED .7 DNX6 (GAUZE/BANDAGES/DRESSINGS) IMPLANT
DRAIN CHANNEL 19F RND (DRAIN) ×2 IMPLANT
DRAPE LAPAROSCOPIC ABDOMINAL (DRAPES) ×2 IMPLANT
EVACUATOR SILICONE 100CC (DRAIN) ×2 IMPLANT
FLUID NSS /IRRIG 1000 ML XXX (MISCELLANEOUS) ×2 IMPLANT
GAUZE SPONGE 4X4 12PLY STRL (GAUZE/BANDAGES/DRESSINGS) ×2 IMPLANT
GLOVE BIO SURGEON STRL SZ 6.5 (GLOVE) ×3 IMPLANT
GLOVE BIO SURGEON STRL SZ7 (GLOVE) ×4 IMPLANT
GLOVE BIO SURGEONS STRL SZ 6.5 (GLOVE) ×3
GLOVE BIOGEL M 8.0 STRL (GLOVE) ×2 IMPLANT
GLOVE BIOGEL PI IND STRL 6.5 (GLOVE) IMPLANT
GLOVE BIOGEL PI IND STRL 7.0 (GLOVE) IMPLANT
GLOVE BIOGEL PI INDICATOR 6.5 (GLOVE) ×2
GLOVE BIOGEL PI INDICATOR 7.0 (GLOVE) ×4
GLOVE SURG SIGNA 7.5 PF LTX (GLOVE) ×2 IMPLANT
GOWN STRL REUS W/TWL MED LVL3 (GOWN DISPOSABLE) ×8 IMPLANT
NDL INSUFFLATION 14GA 120MM (NEEDLE) IMPLANT
NEEDLE 22X1 1/2 (OR ONLY) (NEEDLE) ×2 IMPLANT
NEEDLE INSUFFLATION 14GA 120MM (NEEDLE) ×3 IMPLANT
NS IRRIG 1000ML POUR BTL (IV SOLUTION) ×26 IMPLANT
PACK LAPAROSCOPY I 1258 (SET/KITS/TRAYS/PACK) ×2 IMPLANT
PACK LITHOTOMY IV (CUSTOM PROCEDURE TRAY) ×2 IMPLANT
SPONGE GAUZE 4X4 12PLY STER LF (GAUZE/BANDAGES/DRESSINGS) ×2 IMPLANT
STAPLER VISISTAT 35W (STAPLE) ×2 IMPLANT
SUCTION POOLE TIP (SUCTIONS) ×2 IMPLANT
SUT ETHILON 2 0 FS 18 (SUTURE) ×2 IMPLANT
SUT PDS AB 1 TP1 96 (SUTURE) ×4 IMPLANT
SUT SILK 2 0SH CR/8 30 (SUTURE) ×2 IMPLANT
SYR CONTROL 10ML LL (SYRINGE) ×2 IMPLANT
TAPE CLOTH SURG 6X10 WHT LF (GAUZE/BANDAGES/DRESSINGS) ×2 IMPLANT
TOWEL OR 17X24 6PK STRL BLUE (TOWEL DISPOSABLE) ×2 IMPLANT
TOWEL OR 17X26 10 PK STRL BLUE (TOWEL DISPOSABLE) ×2 IMPLANT
TRAY FOLEY CATH SILVER 14FR (SET/KITS/TRAYS/PACK) ×2 IMPLANT
TROCAR XCEL BLADELESS 5X75MML (TROCAR) ×2 IMPLANT
TROCAR XCEL NON-BLD 11X100MML (ENDOMECHANICALS) ×2 IMPLANT
TUBING INSUFFLATOR HI FLOW (MISCELLANEOUS) ×2 IMPLANT

## 2015-07-19 SURGICAL SUPPLY — 17 items
CANISTERS HI-FLOW 3000CC (CANNISTER) ×2 IMPLANT
CATH ROBINSON RED A/P 16FR (CATHETERS) ×2 IMPLANT
CLOTH BEACON ORANGE TIMEOUT ST (SAFETY) ×2 IMPLANT
CONTAINER PREFILL 10% NBF 60ML (FORM) ×4 IMPLANT
DEVICE MYOSURE LITE (MISCELLANEOUS) ×1 IMPLANT
DEVICE MYOSURE REACH (MISCELLANEOUS) IMPLANT
DILATOR CANAL MILEX (MISCELLANEOUS) IMPLANT
GLOVE BIOGEL PI IND STRL 6.5 (GLOVE) ×2 IMPLANT
GLOVE BIOGEL PI IND STRL 7.0 (GLOVE) ×1 IMPLANT
GLOVE BIOGEL PI INDICATOR 6.5 (GLOVE) ×2
GLOVE BIOGEL PI INDICATOR 7.0 (GLOVE) ×1
GLOVE ECLIPSE 6.5 STRL STRAW (GLOVE) ×2 IMPLANT
GOWN STRL REUS W/TWL LRG LVL3 (GOWN DISPOSABLE) ×4 IMPLANT
PACK VAGINAL MINOR WOMEN LF (CUSTOM PROCEDURE TRAY) ×2 IMPLANT
PAD OB MATERNITY 4.3X12.25 (PERSONAL CARE ITEMS) ×2 IMPLANT
SEAL ROD LENS SCOPE MYOSURE (ABLATOR) ×2 IMPLANT
TOWEL OR 17X24 6PK STRL BLUE (TOWEL DISPOSABLE) ×4 IMPLANT

## 2015-07-19 NOTE — Transfer of Care (Signed)
Immediate Anesthesia Transfer of Care Note  Patient: Brandy Mccormick  Procedure(s) Performed: Procedure(s): LAPAROSCOPY DIAGNOSTIC, EXPLORATORY LAPAROTOMY (N/A)  Patient Location: PACU  Anesthesia Type:General  Level of Consciousness: awake and alert   Airway & Oxygen Therapy: Patient Spontanous Breathing and Patient connected to nasal cannula oxygen  Post-op Assessment: Report given to RN, Post -op Vital signs reviewed and stable and Patient moving all extremities X 4  Post vital signs: Reviewed and stable  Last Vitals:  Filed Vitals:   07/19/15 1036 07/19/15 1316  BP:    Pulse:    Temp:  36.3 C  Resp: 17     Last Pain:  Filed Vitals:   07/19/15 1318  PainSc: 5       Patients Stated Pain Goal: 4 (07/19/15 0945)  Complications: No apparent anesthesia complications

## 2015-07-19 NOTE — Op Note (Signed)
PREOPERATIVE DIAGNOSIS:  Potential uterine perforation POSTOPERATIVE DIAGNOSIS: Uterine perforation, Mesenteric hemorrhage and hemoperitoneum PROCEDURE PERFORMED: Diagnostic laparoscopy, repair of uterine perforation, conversion to open laparotomy SURGEON: Dr. Myna HidalgoJennifer Chequita Mccormick and Dr. Abigail Miyamotoouglas Mccormick ASSISTANT:Brandy Mccormick ANESTHESIA: General endotracheal.  ESTIMATED BLOOD LOSS: 2100cc.  URINE OUTPUT: 400cc of clear yellow urine at the end of the procedure.  IV FLUIDS: 2800 cc of crystalloid and 2units pRBC COMPLICATIONS: None.  CONDITION: stable.  FINDINGS:  Less than 1cm uterine perforation at near right cornua of the uterus, Large hemoperitoneum, mesenteric hemorrhage without injury to the bowel   Informed consent was obtained from the patient prior to taking her to the operating room where anesthesia was found to be adequate. She was placed in dorsal lithotomy position and examined under anesthesia. She was prepped and draped in normal sterile fashion. The bladder was catheterized with a foley under sterile technique.  A bi-valve speculum was then placed and the anterior lip of the cervix was grasped with the single tooth tenaculum. A Hulka manipulator was then inserted for uterine manipulation.Attention was then turned to the patient's abdomen where 10cc of 25% Marcaine was injected, a 5 mm infraumbilical skin incision was made with the scalpel. The veress needle was carefully introduced into the peritoneal cavity while tenting the abdominal wall. Intraperitoneal placement was unable to be confirmed by the saline drop test.  Instead, the abdomen was entered under direct visualization. The abdomen was then insuflated with CO2 gas. Surveillance of the abdomen was performed and large hemopertioneum was appreciated.Two additional 5mm skin incision were made in the left and right lower quadrants with placement of the trocar under direct visualization.   Irrigation was performed and the uterus was  examined and noted to have the perforation as mentioned above.  Bleeding was controlled using electrocautery- hemostasis was obtained.  Suction and irrigation was performed; however, active bleeding remained present.  At that time, general surgery was contacted for further evaluation and assessment of the bowel.  Please see Dr. Eliberto IvoryBlackman's operative report.  Brandy HidalgoJennifer Britiney Blahnik, Brandy Mccormick [phone] (pager) [phone] (office)

## 2015-07-19 NOTE — Interval H&P Note (Signed)
History and Physical Interval Note:  07/19/2015 7:44 AM  Brandy RiedelAndrea J Mccormick  has presented today for surgery, with the diagnosis of N95.0 PMB  The various methods of treatment have been discussed with the patient and family. After consideration of risks, benefits and other options for treatment, the patient has consented to  Procedure(s) with comments: DILATATION & CURETTAGE/HYSTEROSCOPY WITH MYOSURE (N/A) - Polypectomy as a surgical intervention .  The patient's history has been reviewed, patient examined, no change in status, stable for surgery.  I have reviewed the patient's chart and labs.  Questions were answered to the patient's satisfaction.     Myna HidalgoZAN, Mahlon Gabrielle, M

## 2015-07-19 NOTE — Anesthesia Postprocedure Evaluation (Signed)
Anesthesia Post Note  Patient: Brandy Mccormick  Procedure(s) Performed: Procedure(s) (LRB): DILATATION & CURETTAGE/HYSTEROSCOPY WITH MYOSURE (N/A)  Patient location during evaluation: PACU Anesthesia Type: General Level of consciousness: awake and alert Anesthetic complications: no Comments: See progress note for details. [facility] and possible perforation      Last Vitals:  Filed Vitals:   07/19/15 1035 07/19/15 1036  BP:    Pulse: 72   Temp:    Resp: 29 17    Last Pain:  Filed Vitals:   07/19/15 1156  PainSc: 5    Pain Goal: Patients Stated Pain Goal: 4 (07/19/15 0945)               [name]

## 2015-07-19 NOTE — Op Note (Signed)
Operative Report  PreOp: thickened endometrium, postmenopausal bleeding, cervical stenosis PostOp: same Procedure:  Hysteroscopy, Dilation and Curettage, myosure polypectomy Surgeon: Dr. Myna HidalgoJennifer Tallie Dodds Anesthesia: General Complications:concern for uterine perforation EBL: 5mL UOP: 10cc IVF:1000cc  Discrepency 1600cc  Findings: 6cm retroverted uterus with thickened endometrium  Specimens: 1) endometrial curetting and polyp  Procedure: The patient was taken to the operating room where she underwent general anesthesia without difficulty. The patient was placed in a low lithotomy position using Allen stirrups. The patient was examined with the findings as noted above.  She was then prepped and draped in the normal sterile fashion. The bladder was drained using a red rubber urethral catheter. A sterile speculum was inserted into the vagina. A single tooth tenaculum was placed on the anterior lip of the cervix. The uterus was serially dilated to 14 [place] to allow insertion of the hysteroscope. The diagnostic hysteroscope was then inserted with difficulty due to the position of the uterus.  The hysteroscope was inserted with some difficult- polypoid like tissue was noted and a small biopsy was obtained.  Upon using myosure device, a large discrepency was noted and the instruments were removed.  Sharp curettage was performed- good resistance of the fundus was appreciated.  The hysteroscope was then reinserted- again immediately a large discrepancy was noted and due to poor visualization- the procedure was abandoned.  All instrument were then removed. Hemostasis was observed at the cervical site.  The patient was repositioned to the supine position. The patient tolerated the procedure; however, the plan was to discuss with family and pt plan for diagnostic laparoscopy due to concern for uterine perforation.  Myna HidalgoJennifer Chay Mazzoni, DO [phone] (pager) [phone] (office)

## 2015-07-19 NOTE — Anesthesia Postprocedure Evaluation (Signed)
Anesthesia Post Note  Patient: Brandy Mccormick  Procedure(s) Performed: Procedure(s) (LRB): LAPAROSCOPY DIAGNOSTIC, EXPLORATORY LAPAROTOMY (N/A)  Patient location during evaluation: PACU Anesthesia Type: General Level of consciousness: awake, awake and alert and oriented Pain management: pain level controlled Vital Signs Assessment: post-procedure vital signs reviewed and stable Respiratory status: spontaneous breathing, nonlabored ventilation and respiratory function stable Cardiovascular status: blood pressure returned to baseline Anesthetic complications: no    Last Vitals:  Filed Vitals:   07/19/15 1830 07/19/15 1845  BP: 117/74   Pulse: 65 74  Temp:    Resp: 22 18    Last Pain:  Filed Vitals:   07/19/15 1900  PainSc: 6                  Raihan Kimmel COKER

## 2015-07-19 NOTE — Progress Notes (Signed)
Patient awake and alert/ Color Pale / Transferred to [facility] via [facility]

## 2015-07-19 NOTE — Anesthesia Procedure Notes (Signed)
Procedure Name: Intubation Date/Time: 07/19/2015 11:13 AM Performed by: Reine JustFLOWERS, Stephannie Broner T Pre-anesthesia Checklist: Patient identified, Emergency Drugs available, Suction available, Patient being monitored and Timeout performed Patient Re-evaluated:Patient Re-evaluated prior to inductionOxygen Delivery Method: Circle system utilized and Simple face mask Preoxygenation: Pre-oxygenation with 100% oxygen Intubation Type: IV induction, Cricoid Pressure applied and Rapid sequence Laryngoscope Size: Mac and 3 Grade View: Grade I Tube type: Oral Tube size: 7.5 mm Number of attempts: 1 Airway Equipment and Method: Patient positioned with wedge pillow and Stylet Placement Confirmation: ETT inserted through vocal cords under direct vision,  positive ETCO2 and breath sounds checked- equal and bilateral Secured at: 21 cm Tube secured with: Tape Dental Injury: Teeth and Oropharynx as per pre-operative assessment

## 2015-07-19 NOTE — OR Nursing (Signed)
Dr. Charlotta Newtonzan noted the patient was pushing down and subsequently had a bowel movement during hysteroscopy.

## 2015-07-19 NOTE — Progress Notes (Signed)
Due to concern for uterine perforation and potential injury to bowel, [facility] for further assessment and management.  General surgery and [place] notified.  Myna HidalgoJennifer Hardy Harcum, DO [phone] (pager) [phone] (office)

## 2015-07-19 NOTE — OR Nursing (Signed)
Dr. Charlotta Newtonzan called back into room and notified that collected specimen appears to be fat.

## 2015-07-19 NOTE — Op Note (Signed)
LAPAROSCOPY DIAGNOSTIC, EXPLORATORY LAPAROTOMY  Procedure Note  Anette Riedelndrea J Basher 07/19/2015   Pre-op Diagnosis: uterine perforation     Post-op Diagnosis: uterine perforation with mesenteric hemorrhage and hemoperitoneum  Procedure(s): LAPAROSCOPY DIAGNOSTIC, EXPLORATORY LAPAROTOMY OVERSEW OF BLEEDING MESENTERIC INJURY  Surgeon(s): Myna HidalgoJennifer Ozan, DO Abigail Miyamotoouglas Chiyo Fay, MD  Anesthesia: General  Staff:  Circulator: Ronie Spiesynthia R Smith, RN Scrub Person: Lerry LinerMelissa S Van Tassel Circulator Assistant: Fae PippinMichael J Queen, RN RN First Assistant: Hermelinda Dellenracie M Sumner, RN  Estimated Blood Loss: 1500cc                         Abigail MiyamotoBLACKMAN,Soren Pigman A   Date: 07/19/2015  Time: 1:12 PM

## 2015-07-19 NOTE — Progress Notes (Signed)
Patient arrived to PACU and was hemodynamically stable on no vasopressor support. She was alert and oriented. She had a soft abdomen but abdominal pain that decreased with opioid medication. Concern immediately over perforation given intraoperative deficit and fat observation in specimen. Dr. Charlotta Newtonzan discussed with patient immediately in the PACU these findings and alerted us that she would need to go back to the operating room for a diagnostic lap. She also discussed this with the family. Dr. Charlotta Newtonzan informed us that she was contacting general surgery and we would determine the location of this patient's next operation. She was hemodynamically stable, alert, oriented, and with stable 5/10 abdominal pain at this time that had come down from 8/10. We continued to prepare to have this surgery performed while the location was being sorted out. I instructed the PACU team to place an additional PIV and continue fluids. Dr. Charlotta Newtonzan called back to let us know that she had spoken with general surgery, and they want [facility]. House coverage had been aware of this patient and was then contacted again to start the process of the transfer. Dr. Charlotta Newtonzan asked if the family could be made aware of this plan, and I spoke with them. They were understanding of what Dr. Charlotta Newtonzan had discussed with them and of the [facility]. I was called back emergently to the PACU for hypotension and the patient feeling acutely ill. She had a systolic pressure of 54 and was complaining of abdominal pain. We immediately sent a CBC and chemistry, opened fluids, started an albumin bolus, started a phenylephrine infusion, and placed an arterial line while awaiting [facility] arriving. The patient's blood pressure stablized with fluid and phenylephrine, and she became more alert and comfortable. Once they arrived, report was given and the [facility] was [facility] Dr. Charlotta Newtonzan had been contacted and was  aware of this acute change. Report was given to the anesthesia team that [facility]. A type and screen was not drawn because we knew the patient was to be transferred and a new sample would be needed [facility]. I spoke with the family after the patient had been stabilized and brought them to the PACU to see her prior to [facility]. They were updated on her status and given instructions on where to [facility] as well as a number to call if they ran into any problems.

## 2015-07-19 NOTE — OR Nursing (Signed)
As OR team moved pt to stretcher for transport to PACU, pt complaints of abdominal pain, pt states "I'm freezing" with notable shivering. These pt complaints continued in PACU and Dr. Charlotta Newtonzan notified at [phone].

## 2015-07-19 NOTE — Progress Notes (Signed)
Dr. Gentry RochJudd at bedside, A line inserted bty Dr. Gentry RochJudd, patient awake and alert/ continuues to be pale,

## 2015-07-19 NOTE — Progress Notes (Signed)
Patient continues to complain of not feeling well, discomfort in abdomen and shoulders, abdomen soft and tender to touch

## 2015-07-19 NOTE — Transfer of Care (Signed)
Immediate Anesthesia Transfer of Care Note  Patient: Brandy Mccormick  Procedure(s) Performed: Procedure(s) with comments: DILATATION & CURETTAGE/HYSTEROSCOPY WITH MYOSURE (N/A) - Polypectomy  Patient Location: PACU  Anesthesia Type:General  Level of Consciousness: awake, alert , oriented and patient cooperative  Airway & Oxygen Therapy: Patient Spontanous Breathing and Patient connected to nasal cannula oxygen  Post-op Assessment: Report given to RN and Post -op Vital signs reviewed and stable  Post vital signs: Reviewed and stable  Last Vitals:  Filed Vitals:   07/19/15 0727 07/19/15 0905  BP: 110/70   Pulse: 61 73  Temp: 36.8 C 36.4 C  Resp: 18 16    Last Pain: There were no vitals filed for this visit.       Complications: No apparent anesthesia complications

## 2015-07-19 NOTE — Anesthesia Procedure Notes (Addendum)
Procedure Name: LMA Insertion Date/Time: 07/19/2015 8:23 AM Performed by: Earmon PhoenixWILKERSON, Usiel Astarita P Pre-anesthesia Checklist: Patient identified, Timeout performed, Emergency Drugs available, Suction available and Patient being monitored Patient Re-evaluated:Patient Re-evaluated prior to inductionOxygen Delivery Method: Circle system utilized Preoxygenation: Pre-oxygenation with 100% oxygen Intubation Type: IV induction Ventilation: Mask ventilation without difficulty LMA: LMA inserted LMA Size: 4.0 Number of attempts: 1 Placement Confirmation: positive ETCO2,  CO2 detector and breath sounds checked- equal and bilateral Tube secured with: Tape Dental Injury: Teeth and Oropharynx as per pre-operative assessment

## 2015-07-19 NOTE — Progress Notes (Signed)
Patient awake and alert/ states she isn"t  feeling well. Vitals stable, pale, placed in Supine position, dr. Gentry RochJudd called to bedside

## 2015-07-19 NOTE — Anesthesia Preprocedure Evaluation (Signed)
Anesthesia Evaluation  Patient identified by MRN, date of birth, ID band  Reviewed: Allergy & Precautions, NPO status , Patient's Chart, lab work & pertinent test results  Airway Mallampati: II  TM Distance: >3 FB Neck ROM: Full    Dental  (+) Teeth Intact   Pulmonary Current Smoker,    breath sounds clear to auscultation       Cardiovascular hypertension,  Rhythm:Regular Rate:Normal     Neuro/Psych    GI/Hepatic   Endo/Other    Renal/GU      Musculoskeletal   Abdominal   Peds  Hematology   Anesthesia Other Findings   Reproductive/Obstetrics                             Anesthesia Physical Anesthesia Plan  ASA: IV and emergent  Anesthesia Plan: General   Post-op Pain Management:    Induction: Intravenous, Cricoid pressure planned and Rapid sequence  Airway Management Planned: Oral ETT  Additional Equipment:   Intra-op Plan:   Post-operative Plan: Possible Post-op intubation/ventilation  Informed Consent: I have reviewed the patients History and Physical, chart, labs and discussed the procedure including the risks, benefits and alternatives for the proposed anesthesia with the patient or authorized representative who has indicated his/her understanding and acceptance.   Dental advisory given  Plan Discussed with: CRNA and Anesthesiologist  Anesthesia Plan Comments:         Anesthesia Quick Evaluation

## 2015-07-20 ENCOUNTER — Encounter (HOSPITAL_COMMUNITY): Payer: Self-pay | Admitting: Obstetrics & Gynecology

## 2015-07-20 LAB — BASIC METABOLIC PANEL
Anion gap: 9 (ref 5–15)
BUN: <5 mg/dL — ABNORMAL LOW (ref 6–20)
CALCIUM: 7.9 mg/dL — AB (ref 8.9–10.3)
CHLORIDE: 108 mmol/L (ref 101–111)
CO2: 23 mmol/L (ref 22–32)
CREATININE: 0.59 mg/dL (ref 0.44–1.00)
Glucose, Bld: 118 mg/dL — ABNORMAL HIGH (ref 65–99)
POTASSIUM: 4.1 mmol/L (ref 3.5–5.1)
SODIUM: 140 mmol/L (ref 135–145)

## 2015-07-20 LAB — CBC
HCT: 25.6 % — ABNORMAL LOW (ref 36.0–46.0)
HEMATOCRIT: 25.6 % — AB (ref 36.0–46.0)
Hemoglobin: 8.8 g/dL — ABNORMAL LOW (ref 12.0–15.0)
Hemoglobin: 8.9 g/dL — ABNORMAL LOW (ref 12.0–15.0)
MCH: 30.9 pg (ref 26.0–34.0)
MCH: 31.4 pg (ref 26.0–34.0)
MCHC: 34.4 g/dL (ref 30.0–36.0)
MCHC: 34.8 g/dL (ref 30.0–36.0)
MCV: 89.8 fL (ref 78.0–100.0)
MCV: 90.5 fL (ref 78.0–100.0)
PLATELETS: 150 10*3/uL (ref 150–400)
Platelets: 149 10*3/uL — ABNORMAL LOW (ref 150–400)
RBC: 2.85 MIL/uL — ABNORMAL LOW (ref 3.87–5.11)
RBC: 2.83 MIL/uL — ABNORMAL LOW (ref 3.87–5.11)
RDW: 14.1 % (ref 11.5–15.5)
RDW: 14.1 % (ref 11.5–15.5)
WBC: 13.1 10*3/uL — ABNORMAL HIGH (ref 4.0–10.5)
WBC: 10.8 10*3/uL — ABNORMAL HIGH (ref 4.0–10.5)

## 2015-07-20 LAB — BLOOD PRODUCT ORDER (VERBAL) VERIFICATION

## 2015-07-20 LAB — MRSA PCR SCREENING: MRSA by PCR: NEGATIVE

## 2015-07-20 MED ORDER — ENOXAPARIN SODIUM 40 MG/0.4ML ~~LOC~~ SOLN
40.0000 mg | SUBCUTANEOUS | Status: DC
  Administered 2015-07-20 – 2015-07-23 (×3): 40 mg via SUBCUTANEOUS
  Filled 2015-07-20 (×5): qty 0.4

## 2015-07-20 MED ORDER — SODIUM CHLORIDE 0.9 % IR SOLN
Status: DC | PRN
  Administered 2015-07-19: 1800 mL

## 2015-07-20 MED ORDER — NICOTINE 14 MG/24HR TD PT24
14.0000 mg | MEDICATED_PATCH | Freq: Every day | TRANSDERMAL | Status: DC
  Administered 2015-07-20 – 2015-07-25 (×6): 14 mg via TRANSDERMAL
  Filled 2015-07-20 (×6): qty 1

## 2015-07-20 MED ORDER — ACETAMINOPHEN 325 MG PO TABS
650.0000 mg | ORAL_TABLET | Freq: Four times a day (QID) | ORAL | Status: DC | PRN
  Administered 2015-07-20: 650 mg via ORAL
  Filled 2015-07-20 (×2): qty 2

## 2015-07-20 MED ORDER — ACETAMINOPHEN 500 MG PO TABS
1000.0000 mg | ORAL_TABLET | Freq: Three times a day (TID) | ORAL | Status: DC | PRN
  Administered 2015-07-20 – 2015-07-24 (×10): 1000 mg via ORAL
  Filled 2015-07-20 (×10): qty 2

## 2015-07-20 NOTE — Plan of Care (Signed)
Problem: Bowel/Gastric: Goal: Gastrointestinal status for postoperative course will improve Outcome: Progressing Will keep patient on ice chips today per orders

## 2015-07-20 NOTE — Op Note (Signed)
NAMJuliette Mccormick:  Brandy, Mccormick                ACCOUNT NO.:  [id number]  MEDICAL RECORD NO.:  [id number]  LOCATION:  2M12C                        FACILITY:  [facility]  PHYSICIAN:  Abigail Miyamotoouglas Jream Broyles, M.D. DATE OF BIRTH:  05/01/1965  DATE OF PROCEDURE:  07/19/2015 DATE OF DISCHARGE:                              OPERATIVE REPORT   PREOPERATIVE DIAGNOSIS:  Uterine perforation.  POSTOPERATIVE DIAGNOSIS:  Uterine perforation with mesenteric hemorrhage and hemoperitoneum.  PROCEDURE: 1. Diagnostic laparoscopy. 2. Exploratory laparotomy. 3. Oversewn bleeding mesenteric injuries.  SURGEON:  Abigail Miyamotoouglas Lesa Vandall, M.D.  ASSISTANT:  Lorayne MarekJennifer Ozando.  ANESTHESIA:  General endotracheal anesthesia.  ESTIMATED BLOOD LOSS:  1500 mL.  INDICATIONS:  This is a 50 year old female, who earlier today had undergone a hysteroscopy with biopsy with Dr. Ronni Rumblezando.  [facility], she became hypotensive.  Results of the biopsy revealed some adipose tissue.  There was a worry that she had a uterine perforation.  Decision was made to proceed with emergent [facility] room [facility].  Dr. Ronni Rumblezando began with a diagnostic laparoscopy, and General Surgery was consulted.  I was asked to evaluate the patient intraoperatively.  FINDINGS:  The patient was found to have a small perforation of the uterus.  She then had an injury to the redundant sigmoid colon mesentery.  There was a large amount of hemoperitoneum.  There was active venous oozing from the mesentery.  After converting to exploratory laparotomy, no evidence of injury was identified to the large or small intestines and specifically the sigmoid colon in the area of the mesenteric injury.  PROCEDURE IN DETAIL:  The patient was already in the operating room undergoing diagnostic laparoscopy when I presented into the room.  I evaluated the sigmoid colon and there was hematoma around it.  I decided to  convert to an open laparotomy in order to better evaluate the colon to rule out an injury.  At this point, insufflation was stopped.  All ports were removed.  A midline incision was then created with a scalpel. This was then taken down through the subcutaneous tissue to the fascia which was then opened in the midline with the electrocautery under direct vision.  The peritoneum was then opened the entire length of the incision.  Upon entering the abdomen, there was a large amount of hemoperitoneum identified.  Several large clots were removed.  I then evaluated her redundant sigmoid colon.  There was an injury to the colonic mesentery.  I was able to achieve hemostasis with several 2-0 silk sutures.  I then circumferentially evaluated the sigmoid colon in this area.  There was a large amount of stool in the colon itself.  I was able to evaluate the wall.  There was mild hematoma that I tracked it from the mesentery but no obvious injury to the bowel itself.  I thoroughly investigated this area as well as the rest of the sigmoid colon and saw no evidence of injury.  The patient's small bowel was run from the terminal ileum back to the ligament of Treitz and I saw no evidence of small bowel injury as well.  The rest of  the colon was examined and found to be free of injury also.  I then thoroughly irrigated the abdomen with multiple liters of normal saline and suctioning out all the hemoperitoneum.  At this point, the uterus was again evaluated and there was no evidence of bleeding from the perforation of the uterus.  I again examined the colon and saw no evidence of injury.  I did __________ several epiploica to the area of the mesenteric injury and colon.  We then used the lateral port site from the left side to place a 19-French Blake drain into the pelvis. Again, the abdomen was thoroughly explored and hemostasis was felt to be achieved.  At this point, all laparotomy pads were removed.   The patient's midline fascia was then closed with running #1 looped PDS suture.  The drain was sutured in place with a nylon suture.  All skin incisions were then irrigated and closed with skin staples.  The patient tolerated the procedure fairly well.  She was off pressors by the end of the procedure.  She received several units of packed red blood cells during the procedure.  At this point, decision was made to extubate her in the operating room and take her in a stable condition to the recovery area.     Abigail Miyamoto, M.D.     DB/MEDQ  D:  07/19/2015  T:  07/20/2015  Job:  [id number]

## 2015-07-20 NOTE — Progress Notes (Signed)
1 Day Post-Op  Subjective: POD 1 s/p ex lap for mesenteric hemorrhage following hysteroscopy.  Extubated.  Complains of abdominal soreness and a sore throat.  No passage of flatus.  No BM.  Objective: Vital signs in last 24 hours: Temp:  [97.4 F (36.3 C)-100.3 F (37.9 C)] 99 F (37.2 C) (05/11 0331) Pulse Rate:  [56-92] 65 (05/11 0800) Resp:  [7-34] 13 (05/11 0800) BP: (55-137)/(35-92) 131/68 mmHg (05/11 0800) SpO2:  [87 %-100 %] 96 % (05/11 0800) Arterial Line BP: (104-153)/(51-68) 131/57 mmHg (05/11 0800) Weight:  [70.3 kg (154 lb 15.7 oz)] 70.3 kg (154 lb 15.7 oz) (05/10 2130) Last BM Date:  (PTA)  Intake/Output from previous day: 05/10 0701 - 05/11 0700 In: 6807.5 [I.V.:5887.5; Blood:670; IV Piggyback:250] Out: 5255 [Urine:2550; Drains:200; Blood:2505] Intake/Output this shift: Total I/O In: 125 [I.V.:125] Out: 200 [Urine:200]  General appearance: alert, cooperative, appears stated age and no distress Resp: Nonlabored respirations. GI: Soft.  Appropriately tender to palpation.  Dressing in place.  Some mild sharowing on dressing.  JP with 15cc of SS fluid.    Lab Results:   Recent Labs  07/19/15 1534 07/20/15 0330  WBC 14.3* 13.1*  HGB 10.5* 8.8*  HCT 30.2* 25.6*  PLT 172 149*   BMET  Recent Labs  07/19/15 1018  07/19/15 1231 07/20/15 0330  NA 140  < > 138 140  K 4.2  < > 4.1 4.1  CL 110  --   --  108  CO2 25  --   --  23  GLUCOSE 109*  --   --  118*  BUN 11  --   --  <5*  CREATININE 0.68  --   --  0.59  CALCIUM 8.2*  --   --  7.9*  < > = values in this interval not displayed. PT/INR No results for input(s): LABPROT, INR in the last 72 hours. ABG  Recent Labs  07/19/15 1124 07/19/15 1231  PHART 7.320* 7.334*  HCO3 23.9 21.9    Studies/Results: Dg Chest Port 1 View  07/19/2015  CLINICAL DATA:  Central line placement EXAM: PORTABLE CHEST 1 VIEW COMPARISON:  None. FINDINGS: Cardiomediastinal silhouette is unremarkable. There is left IJ  central line with tip in SVC. No pneumothorax. No acute infiltrate or pulmonary edema. IMPRESSION: Left IJ central line with tip in SVC.  No pneumothorax. Electronically Signed   By: Natasha MeadLiviu  Pop M.D.   On: 07/19/2015 15:05   Dg Abd Portable 1v  07/19/2015  CLINICAL DATA:  Surgery follow-up.  Hysteroscopy. EXAM: PORTABLE ABDOMEN - 1 VIEW COMPARISON:  None. FINDINGS: No dilated small bowel loops. Mild stool throughout the visualized colon. No definite evidence of pneumatosis or pneumoperitoneum on this limited supine radiograph. Gas overlying the lower sacrum is favored to represent rectal gas, although gas within the vagina or uterus cannot be excluded. No pathologic soft tissue calcifications. Mild degenerative changes in the visualized thoracolumbar spine. IMPRESSION: Nonobstructive bowel gas pattern. No definite free air on this limited supine radiograph. Gas overlying the lower sacrum is favored to represent rectal gas, although gas within the vagina or uterus cannot be excluded. Decubitus or upright abdominal views are recommended if there is continued clinical concern for pneumoperitoneum. Electronically Signed   By: Delbert PhenixJason A Poff M.D.   On: 07/19/2015 10:53    Anti-infectives: Anti-infectives    Start     Dose/Rate Route Frequency Ordered Stop   07/20/15 0600  ceFAZolin (ANCEF) IVPB 2g/100 mL premix  Status:  Discontinued  2 g 200 mL/hr over 30 Minutes Intravenous To ShortStay Surgical 07/19/15 2127 07/20/15 0819      Assessment/Plan: s/p Procedure(s): LAPAROSCOPY DIAGNOSTIC, EXPLORATORY LAPAROTOMY (N/A) Extubated and stable.  Would encourage patient to be OOB multiple times today and ambulate.  Will hold on ice chips and water until some demonstration of return of bowel function.  Hgb down from check yesterday, will continue to monitor.  Recheck at noon.  Lovenox held until repeat check.  LOS: 1 day    Concha Se 07/20/2015

## 2015-07-20 NOTE — Progress Notes (Signed)
GYN Progress Note, POD#1  S: Patient drowsy, resting comfortably in bed.  States she feels very sore, but that the pain medicine does help.  Denies fever/chills, some trouble taking a full breath due to pain, but denies SOB.  No flatus.  Tolerating ice chips, no nausea or vomiting.  O: BP 130/66 mmHg  Pulse 80  Temp(Src) 97.8 F (36.6 C) (Oral)  Resp 13  Ht 5\' 3"  (1.6 m)  Wt 70.3 kg (154 lb 15.7 oz)  BMI 27.46 kg/m2  SpO2 96% Foley in place, adequate UOP  Gen: NAD CV: RRR Lungs: CTAB Abd: soft, appropriately tender, +BS Midline vertical incision with bandage- clean and dry- old blood marked JP drain in place with minimal serosanguinous fluid GU: Pad dry, no vaginal bleeding Ext: SCDs in place, no calf tenderness bilaterally  CBC Latest Ref Rng 07/20/2015 07/20/2015 07/19/2015  WBC 4.0 - 10.5 K/uL 10.8(H) 13.1(H) 14.3(H)  Hemoglobin 12.0 - 15.0 g/dL 6.2(Z8.9(L) 3.0(Q8.8(L) 10.5(L)  Hematocrit 36.0 - 46.0 % 25.6(L) 25.6(L) 30.2(L)  Platelets 150 - 400 K/uL 150 149(L) 172    A/P: 50 yo postmenopausal female s/p hysteroscopy, myosure resection complicated by uterine perforation requiring diagnostic laparoscopy, converted to open with repair of mesentery and evacuation of hemoperitoneum -Pain: currently on morphine PCA -Heme: EBL  2100cc, s/p 2upRBCs, Hgb stable as above. VSS. -GU: Foley in place, adequate UOP -GI: tolerating ice chips without complaints -From a gynecologic standpoint, there is no active management.  Overall management per general surgery, will sign off at this time, please consult as needed for management of her care.  Myna HidalgoJennifer Cherlynn Popiel, DO [phone] (pager) [phone] (office)

## 2015-07-21 LAB — BASIC METABOLIC PANEL
Anion gap: 11 (ref 5–15)
BUN: <5 mg/dL — ABNORMAL LOW (ref 6–20)
CHLORIDE: 106 mmol/L (ref 101–111)
CO2: 24 mmol/L (ref 22–32)
Calcium: 8.2 mg/dL — ABNORMAL LOW (ref 8.9–10.3)
Creatinine, Ser: 0.55 mg/dL (ref 0.44–1.00)
GFR calc Af Amer: >60 mL/min (ref >60)
Glucose, Bld: 88 mg/dL (ref 65–99)
POTASSIUM: 4 mmol/L (ref 3.5–5.1)
SODIUM: 141 mmol/L (ref 135–145)

## 2015-07-21 LAB — CBC
HEMATOCRIT: 22.9 % — AB (ref 36.0–46.0)
HEMATOCRIT: 23 % — AB (ref 36.0–46.0)
HEMOGLOBIN: 7.9 g/dL — AB (ref 12.0–15.0)
HEMOGLOBIN: 7.7 g/dL — AB (ref 12.0–15.0)
MCH: 31.6 pg (ref 26.0–34.0)
MCH: 30.1 pg (ref 26.0–34.0)
MCHC: 34.5 g/dL (ref 30.0–36.0)
MCHC: 33.5 g/dL (ref 30.0–36.0)
MCV: 91.6 fL (ref 78.0–100.0)
MCV: 89.8 fL (ref 78.0–100.0)
Platelets: 141 10*3/uL — ABNORMAL LOW (ref 150–400)
Platelets: 141 10*3/uL — ABNORMAL LOW (ref 150–400)
RBC: 2.5 MIL/uL — AB (ref 3.87–5.11)
RBC: 2.56 MIL/uL — AB (ref 3.87–5.11)
RDW: 13.7 % (ref 11.5–15.5)
RDW: 13.5 % (ref 11.5–15.5)
WBC: 8.1 10*3/uL (ref 4.0–10.5)
WBC: 7.1 10*3/uL (ref 4.0–10.5)

## 2015-07-21 MED ORDER — SODIUM CHLORIDE 0.9% FLUSH
10.0000 mL | INTRAVENOUS | Status: DC | PRN
  Administered 2015-07-22 – 2015-07-24 (×3): 20 mL
  Administered 2015-07-24: 10 mL
  Filled 2015-07-21 (×4): qty 40

## 2015-07-21 NOTE — Progress Notes (Signed)
witnissed Brandy CollinLaruren Mccormick Mcgough replace PCA syringe

## 2015-07-21 NOTE — Progress Notes (Signed)
Patient transferring to 6 [address] med surg bed 07, report called to 3M CompanyLaura RN. Family at bedside aware of the transfer.  Dashanique Brownstein, Kae HellerMiranda Lynn, RN

## 2015-07-21 NOTE — Progress Notes (Signed)
2 Days Post-Op  Subjective: NAE overnight.  Complaining this morning of feeling that her bladder is full and headaches.  Denies any changes in vision, nausea, or other assoc symptoms with headaches.  Tolerated ice chips and water yesterday.  No bowel movement or passage of flatus.  Objective: Vital signs in last 24 hours: Temp:  [97.8 F (36.6 C)-99.3 F (37.4 C)] 97.9 F (36.6 C) (05/12 0722) Pulse Rate:  [60-105] 77 (05/12 0600) Resp:  [10-29] 17 (05/12 0600) BP: (117-161)/(60-79) 133/75 mmHg (05/12 0600) SpO2:  [94 %-100 %] 99 % (05/12 0600) Arterial Line BP: (132-176)/(56-75) 176/75 mmHg (05/11 1500) Last BM Date:  (PTA)  Intake/Output from previous day: 05/11 0701 - 05/12 0700 In: 2875 [I.V.:2875] Out: 3175 [Urine:3125; Drains:50] Intake/Output this shift:    General appearance: alert, cooperative, appears stated age and no distress GI: Soft.  appropriately tender to palpation adjacent to incisions.  Drain in RLQ with SS fluid. Dressings removed, incisions clean/dry/intact.   Lab Results:   Recent Labs  07/20/15 1201 07/21/15 0740  WBC 10.8* 8.1  HGB 8.9* 7.9*  HCT 25.6* 22.9*  PLT 150 141*   BMET  Recent Labs  07/20/15 0330 07/21/15 0740  NA 140 141  K 4.1 4.0  CL 108 106  CO2 23 24  GLUCOSE 118* 88  BUN <5* <5*  CREATININE 0.59 0.55  CALCIUM 7.9* 8.2*   PT/INR No results for input(s): LABPROT, INR in the last 72 hours. ABG  Recent Labs  07/19/15 1124 07/19/15 1231  PHART 7.320* 7.334*  HCO3 23.9 21.9    Studies/Results: Dg Chest Port 1 View  07/19/2015  CLINICAL DATA:  Central line placement EXAM: PORTABLE CHEST 1 VIEW COMPARISON:  None. FINDINGS: Cardiomediastinal silhouette is unremarkable. There is left IJ central line with tip in SVC. No pneumothorax. No acute infiltrate or pulmonary edema. IMPRESSION: Left IJ central line with tip in SVC.  No pneumothorax. Electronically Signed   By: Natasha MeadLiviu  Pop M.D.   On: 07/19/2015 15:05   Dg Abd  Portable 1v  07/19/2015  CLINICAL DATA:  Surgery follow-up.  Hysteroscopy. EXAM: PORTABLE ABDOMEN - 1 VIEW COMPARISON:  None. FINDINGS: No dilated small bowel loops. Mild stool throughout the visualized colon. No definite evidence of pneumatosis or pneumoperitoneum on this limited supine radiograph. Gas overlying the lower sacrum is favored to represent rectal gas, although gas within the vagina or uterus cannot be excluded. No pathologic soft tissue calcifications. Mild degenerative changes in the visualized thoracolumbar spine. IMPRESSION: Nonobstructive bowel gas pattern. No definite free air on this limited supine radiograph. Gas overlying the lower sacrum is favored to represent rectal gas, although gas within the vagina or uterus cannot be excluded. Decubitus or upright abdominal views are recommended if there is continued clinical concern for pneumoperitoneum. Electronically Signed   By: Delbert PhenixJason A Poff M.D.   On: 07/19/2015 10:53    Anti-infectives: Anti-infectives    Start     Dose/Rate Route Frequency Ordered Stop   07/20/15 0600  ceFAZolin (ANCEF) IVPB 2g/100 mL premix  Status:  Discontinued     2 g 200 mL/hr over 30 Minutes Intravenous To ShortStay Surgical 07/19/15 2127 07/20/15 0819      Assessment/Plan: s/p Procedure(s): LAPAROSCOPY DIAGNOSTIC, EXPLORATORY LAPAROTOMY (N/A)  POD 2 s/p diagnostic laparoscopy converted to exploratory laparotomy for mesenteric injury during hysteroscopy. Doing well. Hgb down 1g from yesterday.  ? Dilutional, but lovenox was started yesterday.  Will recheck CBC today and hold lovenox until results. Can start CLD,  instructed to avoid carbonated beverages Encouraged ambulation. Feeling of bladder fullness secondary to "air lock" in foley, when manipulated, >800ccs drained.  Will d/c foley. Will decrease IVF to 75 as she is taking in PO. Transfer to floor.  LOS: 2 days    Concha Se 07/21/2015

## 2015-07-22 MED ORDER — PANTOPRAZOLE SODIUM 40 MG PO TBEC
40.0000 mg | DELAYED_RELEASE_TABLET | Freq: Every day | ORAL | Status: DC
  Administered 2015-07-22 – 2015-07-25 (×4): 40 mg via ORAL
  Filled 2015-07-22 (×4): qty 1

## 2015-07-22 MED ORDER — IBUPROFEN 400 MG PO TABS
400.0000 mg | ORAL_TABLET | Freq: Once | ORAL | Status: AC
  Administered 2015-07-22: 400 mg via ORAL
  Filled 2015-07-22: qty 1

## 2015-07-22 MED ORDER — HYDROCODONE-ACETAMINOPHEN 7.5-325 MG/15ML PO SOLN
10.0000 mL | ORAL | Status: DC | PRN
  Administered 2015-07-22 – 2015-07-25 (×8): 10 mL via ORAL
  Filled 2015-07-22 (×8): qty 15

## 2015-07-22 MED ORDER — SENNOSIDES-DOCUSATE SODIUM 8.6-50 MG PO TABS
2.0000 | ORAL_TABLET | Freq: Two times a day (BID) | ORAL | Status: DC
  Administered 2015-07-22 – 2015-07-25 (×7): 2 via ORAL
  Filled 2015-07-22 (×7): qty 2

## 2015-07-22 MED ORDER — HYDROMORPHONE HCL 1 MG/ML IJ SOLN
1.0000 mg | INTRAMUSCULAR | Status: DC | PRN
  Administered 2015-07-22 – 2015-07-25 (×16): 1 mg via INTRAVENOUS
  Filled 2015-07-22 (×16): qty 1

## 2015-07-22 NOTE — Progress Notes (Signed)
3 Days Post-Op  Subjective: Stable and alert.  Very cooperative.  Working hard on Facilities managerincentive spirometer. Passing lots of flatus but no stool Tolerating clear liquids but wants to advance to something with more taste. Hemoglobin yesterday was stable at 7.7.  No labs today  Objective: Vital signs in last 24 hours: Temp:  [98 F (36.7 C)-99.1 F (37.3 C)] 98 F (36.7 C) (05/13 0514) Pulse Rate:  [62-86] 63 (05/13 0514) Resp:  [8-19] 8 (05/13 0800) BP: (118-153)/(65-76) 136/68 mmHg (05/13 0514) SpO2:  [96 %-100 %] 98 % (05/13 0514) Weight:  [70.4 kg (155 lb 3.3 oz)] 70.4 kg (155 lb 3.3 oz) (05/12 1215) Last BM Date: 07/18/15  Intake/Output from previous day: 05/12 0701 - 05/13 0700 In: 2188.3 [P.O.:340; I.V.:1848.3] Out: 4125 [Urine:4100; Drains:25] Intake/Output this shift:    General appearance: Alert and stable.  Mental status normal. Resp: clear to auscultation bilaterally GI: Soft.  Nondistended.  Active bowel sounds.  Lower midline wound clean with staples in place.  25 mL serosanguineous output drain over 24 hours.  Lab Results:  Results for orders placed or performed during the hospital encounter of 07/19/15 (from the past 24 hour(s))  CBC     Status: Abnormal   Collection Time: 07/21/15  2:20 PM  Result Value Ref Range   WBC 7.1 4.0 - 10.5 K/uL   RBC 2.56 (L) 3.87 - 5.11 MIL/uL   Hemoglobin 7.7 (L) 12.0 - 15.0 g/dL   HCT 40.923.0 (L) 81.136.0 - 91.446.0 %   MCV 89.8 78.0 - 100.0 fL   MCH 30.1 26.0 - 34.0 pg   MCHC 33.5 30.0 - 36.0 g/dL   RDW 78.213.5 95.611.5 - 21.315.5 %   Platelets 141 (L) 150 - 400 K/uL     Studies/Results: No results found.  . enoxaparin (LOVENOX) injection  40 mg Subcutaneous Q24H  . nicotine  14 mg Transdermal Daily  . pantoprazole  40 mg Oral Daily  . senna-docusate  2 tablet Oral BID     Assessment/Plan: s/p Procedure(s): LAPAROSCOPY DIAGNOSTIC, EXPLORATORY LAPAROTOMY  POD 3  s/p diagnostic laparoscopy converted to exploratory laparotomy for  mesenteric injury during hysteroscopy. Doing well. Advance diet as tolerated Start Senokot twice a day Discontinue PCA and substitute oral and IV when necessary meds Increased ambulation Check CBC tomorrow   @PROBHOSP @  LOS: 3 days    Illyana Schorsch M 07/22/2015  . .prob

## 2015-07-22 NOTE — Progress Notes (Signed)
Pt c/o worsening HA this afternoon in spite of Hycet/Dilaudid and heat tried.  No visual changes. It starts at the base of the back of the head and goes to the front forehead area. Dr. Janee Mornhompson texted.

## 2015-07-22 NOTE — Progress Notes (Signed)
SCDs placed on pt.

## 2015-07-23 LAB — TYPE AND SCREEN
ABO/RH(D): O POS
ANTIBODY SCREEN: NEGATIVE
UNIT DIVISION: 0
UNIT DIVISION: 0
Unit division: 0
Unit division: 0
Unit division: 0
Unit division: 0

## 2015-07-23 LAB — CBC
HEMATOCRIT: 22.4 % — AB (ref 36.0–46.0)
Hemoglobin: 7.6 g/dL — ABNORMAL LOW (ref 12.0–15.0)
MCH: 30.9 pg (ref 26.0–34.0)
MCHC: 33.9 g/dL (ref 30.0–36.0)
MCV: 91.1 fL (ref 78.0–100.0)
Platelets: 185 10*3/uL (ref 150–400)
RBC: 2.46 MIL/uL — AB (ref 3.87–5.11)
RDW: 13.2 % (ref 11.5–15.5)
WBC: 6.1 10*3/uL (ref 4.0–10.5)

## 2015-07-23 MED ORDER — POLYETHYLENE GLYCOL 3350 17 G PO PACK
17.0000 g | PACK | Freq: Three times a day (TID) | ORAL | Status: DC
  Administered 2015-07-23 – 2015-07-24 (×5): 17 g via ORAL
  Filled 2015-07-23 (×5): qty 1

## 2015-07-23 MED ORDER — FERROUS SULFATE 325 (65 FE) MG PO TABS
325.0000 mg | ORAL_TABLET | Freq: Two times a day (BID) | ORAL | Status: DC
  Administered 2015-07-23 – 2015-07-25 (×5): 325 mg via ORAL
  Filled 2015-07-23 (×5): qty 1

## 2015-07-23 NOTE — Progress Notes (Signed)
4 Days Post-Op  Subjective: Stable and alert and cooperative. Lots of flatus but still no stool..  Worried that it will hurt when she has a bowel movement. We discussed and she agreed to intensifying MiraLAX  Had a headache.  Better after Tylenol.  Has been ambulating in room but not in hall.  Abdominal pain seems controlled.  Drain output only 25 mL.  Serosanguineous. Hemoglobin 7.6.  Stable.  Chemistries normal.  Objective: Vital signs in last 24 hours: Temp:  [98.5 F (36.9 C)-98.7 F (37.1 C)] 98.6 F (37 C) (05/14 0515) Pulse Rate:  [74-88] 74 (05/14 0515) Resp:  [17-18] 17 (05/14 0515) BP: (105-158)/(57-93) 158/93 mmHg (05/14 0515) SpO2:  [96 %] 96 % (05/14 0515) Last BM Date: 07/18/15  Intake/Output from previous day: 05/13 0701 - 05/14 0700 In: 2142.5 [P.O.:1120; I.V.:1022.5] Out: 2875 [Urine:2850; Drains:25] Intake/Output this shift:   General appearance: Alert and stable. Mental status normal. Resp: clear to auscultation bilaterally GI: Soft. Nondistended. Active bowel sounds. Lower midline wound clean with staples in place. 25 mL serosanguineous output drain over 24 hours.    Lab Results:  Results for orders placed or performed during the hospital encounter of 07/19/15 (from the past 24 hour(s))  CBC     Status: Abnormal   Collection Time: 07/23/15  4:51 AM  Result Value Ref Range   WBC 6.1 4.0 - 10.5 K/uL   RBC 2.46 (L) 3.87 - 5.11 MIL/uL   Hemoglobin 7.6 (L) 12.0 - 15.0 g/dL   HCT 62.322.4 (L) 76.236.0 - 83.146.0 %   MCV 91.1 78.0 - 100.0 fL   MCH 30.9 26.0 - 34.0 pg   MCHC 33.9 30.0 - 36.0 g/dL   RDW 51.713.2 61.611.5 - 07.315.5 %   Platelets 185 150 - 400 K/uL     Studies/Results: No results found.  . enoxaparin (LOVENOX) injection  40 mg Subcutaneous Q24H  . nicotine  14 mg Transdermal Daily  . pantoprazole  40 mg Oral Daily  . senna-docusate  2 tablet Oral BID     Assessment/Plan: s/p Procedure(s): LAPAROSCOPY DIAGNOSTIC, EXPLORATORY LAPAROTOMY  POD #4.   Laparoscopy converted to laparotomy for mesenteric injury during hysteroscopy.  Uterine repair No evidence of ongoing bleeding Drain out tomorrow Advance diet as tolerated  Constipation.  Already on stool softeners and Senokot.  Will give MiraLAX today. Hopefully home in 20 4048 hours.  Acute blood loss anemia.  Will start iron.  @PROBHOSP @  LOS: 4 days    Nobuo Nunziata M 07/23/2015  . .prob

## 2015-07-24 NOTE — Progress Notes (Signed)
Pt. Stated she did not wanted to eat her breakfast tray . I asked her if she wanted  any snack that we had on the floor . Pt. Asked for a jello .

## 2015-07-24 NOTE — Progress Notes (Signed)
5 Days Post-Op  Subjective:    She is doing will this AM.  Still pretty sore.  Site looks fine.  She has a pureed diet and she is not interested in it at all.  Had BM x 2 yesterday, and notes it was very painful.  Tolerating diet she has taken.  Says she is living on Mashed potatoes and jello.    Objective: Vital signs in last 24 hours: Temp:  [98.4 F (36.9 C)-98.6 F (37 C)] 98.6 F (37 C) (05/15 0514) Pulse Rate:  [88-91] 88 (05/15 0514) Resp:  [18-19] 19 (05/15 0514) BP: (123-147)/(64-82) 147/82 mmHg (05/15 0514) SpO2:  [95 %-99 %] 96 % (05/15 0514) Last BM Date: 07/23/15 PO 1180 Voided x 5 Drain 100 BM x 2  Afebrile, VSS H/H 7.6/22  07/23/15 down from 13.9/39.3 07/18/15  Intake/Output from previous day: 05/14 0701 - 05/15 0700 In: 2822 [P.O.:1180; I.V.:1642] Out: 100 [Drains:100] Intake/Output this shift:    Lab Results:   Recent Labs  07/21/15 1420 07/23/15 0451  WBC 7.1 6.1  HGB 7.7* 7.6*  HCT 23.0* 22.4*  PLT 141* 185    BMET No results for input(s): NA, K, CL, CO2, GLUCOSE, BUN, CREATININE, CALCIUM in the last 72 hours. PT/INR No results for input(s): LABPROT, INR in the last 72 hours.   Recent Labs Lab 07/18/15 1555 07/19/15 1018  AST 21 16  ALT 17 12*  ALKPHOS 61 43  BILITOT 0.1* 0.2*  PROT 7.0 5.1*  ALBUMIN 4.3 3.2*     Lipase  No results found for: LIPASE   PE:  Alert and no distress, cooperative and had questions about surgery.   Chest: clear Abd:  Drainage is serous, Incision looks fine, + BS, and + BM     Studies/Results: No results found. Prior to Admission medications   Medication Sig Start Date End Date Taking? Authorizing Provider  HYDROcodone-acetaminophen (NORCO/VICODIN) 5-325 MG tablet Take 2 tablets by mouth 2 (two) times daily as needed for moderate pain.  07/01/15  Yes Historical Provider, MD  lisinopril (PRINIVIL,ZESTRIL) 10 MG tablet Take 10 mg by mouth daily.   Yes Historical Provider, MD  ibuprofen  (ADVIL,MOTRIN) 800 MG tablet Take 800 mg by mouth 3 (three) times daily as needed for moderate pain.  05/26/15   Historical Provider, MD    Medications: . enoxaparin (LOVENOX) injection  40 mg Subcutaneous Q24H  . ferrous sulfate  325 mg Oral BID WC  . nicotine  14 mg Transdermal Daily  . pantoprazole  40 mg Oral Daily  . polyethylene glycol  17 g Oral TID AC  . senna-docusate  2 tablet Oral BID   . 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  She has 4 large dogs and is working on where she is going to stay when she leaves. Drain d/ced.  Steri strips applied.     LOS: 5 days    Jari Carollo 07/24/2015 [phone]

## 2015-07-24 NOTE — Discharge Instructions (Signed)
[facility]      [facility] [phone]  OPEN ABDOMINAL SURGERY: POST OP INSTRUCTIONS  Always review your discharge instruction sheet given to you by the facility where your surgery was performed.  IF YOU HAVE DISABILITY OR FAMILY LEAVE FORMS, YOU MUST BRING THEM TO THE OFFICE FOR PROCESSING.  PLEASE DO NOT GIVE THEM TO YOUR DOCTOR.  1. A prescription for pain medication may be given to you upon discharge.  Take your pain medication as prescribed, if needed.  If narcotic pain medicine is not needed, then you may take acetaminophen (Tylenol) or ibuprofen (Advil) as needed. 2. Take your usually prescribed medications unless otherwise directed. 3. If you need a refill on your pain medication, please contact your pharmacy. They will contact our office to request authorization.  Prescriptions will not be filled after 5pm or on week-ends. 4. You should follow a light diet the first few days after arrival home, such as soup and crackers, pudding, etc.unless your doctor has advised otherwise. A high-fiber, low fat diet can be resumed as tolerated.   Be sure to include lots of fluids daily. Most patients will experience some swelling and bruising on the chest and neck area.  Ice packs will help.  Swelling and bruising can take several days to resolve 5. Most patients will experience some swelling and bruising in the area of the incision. Ice pack will help. Swelling and bruising can take several days to resolve..  6. It is common to experience some constipation if taking pain medication after surgery.  Increasing fluid intake and taking a stool softener will usually help or prevent this problem from occurring.  A mild laxative (Milk of Magnesia or Miralax) should be taken according to package directions if there are no bowel movements after 48 hours. 7.  You may have steri-strips (small skin tapes) in place directly over the incision.  These strips should be left on the skin for 7-10 days.  If your  surgeon used skin glue on the incision, you may shower in 24 hours.  The glue will flake off over the next 2-3 weeks.  Any sutures or staples will be removed at the office during your follow-up visit. You may find that a light gauze bandage over your incision may keep your staples from being rubbed or pulled. You may shower and replace the bandage daily. 8. ACTIVITIES:  You may resume regular (light) daily activities beginning the next day--such as daily self-care, walking, climbing stairs--gradually increasing activities as tolerated.  You may have sexual intercourse when it is comfortable.  Refrain from any heavy lifting or straining until approved by your doctor. a. You may drive when you no longer are taking prescription pain medication, you can comfortably wear a seatbelt, and you can safely maneuver your car and apply brakes b. Return to Work: ___________________________________ 9. You should see your doctor in the office for a follow-up appointment approximately two weeks after your surgery.  Make sure that you call for this appointment within a day or two after you arrive home to insure a convenient appointment time. OTHER INSTRUCTIONS:  _____________________________________________________________ _____________________________________________________________  WHEN TO CALL YOUR DOCTOR: 1. Fever over 101.0 2. Inability to urinate 3. Nausea and/or vomiting 4. Extreme swelling or bruising 5. Continued bleeding from incision. 6. Increased pain, redness, or drainage from the incision. 7. Difficulty swallowing or breathing 8. Muscle cramping or spasms. 9. Numbness or tingling in hands or feet or around lips.  The clinic staff is available to   answer your questions during regular business hours.  Please don't hesitate to call and ask to speak to one of the nurses if you have concerns.  For further questions, please visit [email or web address]   

## 2015-07-25 ENCOUNTER — Encounter: Payer: Self-pay | Admitting: General Surgery

## 2015-07-25 LAB — HEMOGLOBIN AND HEMATOCRIT, BLOOD
HEMATOCRIT: 26.3 % — AB (ref 36.0–46.0)
HEMOGLOBIN: 9.2 g/dL — AB (ref 12.0–15.0)

## 2015-07-25 LAB — URINE MICROSCOPIC-ADD ON: Squamous Epithelial / LPF: NONE SEEN

## 2015-07-25 LAB — URINALYSIS, ROUTINE W REFLEX MICROSCOPIC
Bilirubin Urine: NEGATIVE
GLUCOSE, UA: NEGATIVE mg/dL
KETONES UR: NEGATIVE mg/dL
Leukocytes, UA: NEGATIVE
Nitrite: NEGATIVE
PH: 7.5 (ref 5.0–8.0)
PROTEIN: NEGATIVE mg/dL
Specific Gravity, Urine: 1.009 (ref 1.005–1.030)

## 2015-07-25 MED ORDER — CENTRUM PO CHEW: CHEWABLE_TABLET | ORAL | Status: DC

## 2015-07-25 MED ORDER — HYDROCODONE-ACETAMINOPHEN 5-325 MG PO TABS: 1.0000 | ORAL_TABLET | ORAL | Status: DC | PRN

## 2015-07-25 MED ORDER — NICOTINE 14 MG/24HR TD PT24: MEDICATED_PATCH | TRANSDERMAL | Status: DC

## 2015-07-25 MED ORDER — HYDROCODONE-ACETAMINOPHEN 7.5-325 MG PO TABS: 1.0000 | ORAL_TABLET | Freq: Four times a day (QID) | ORAL | Status: DC | PRN

## 2015-07-25 MED ORDER — IBUPROFEN 200 MG PO TABS: ORAL_TABLET | ORAL | Status: DC

## 2015-07-25 MED ORDER — FERROUS SULFATE 325 (65 FE) MG PO TABS: 325.0000 mg | ORAL_TABLET | Freq: Every day | ORAL | Status: DC

## 2015-07-25 MED ORDER — HYDROCODONE-ACETAMINOPHEN 7.5-325 MG PO TABS
1.0000 | ORAL_TABLET | Freq: Four times a day (QID) | ORAL | Status: DC | PRN
  Administered 2015-07-25: 2 via ORAL
  Filled 2015-07-25: qty 2

## 2015-07-25 NOTE — Progress Notes (Signed)
6 Days Post-Op  Subjective: Complains of pink discharge with voiding, still worried about her hemoglobin.    Objective: Vital signs in last 24 hours: Temp:  [98.1 F (36.7 C)-98.2 F (36.8 C)] 98.2 F (36.8 C) (05/16 0454) Pulse Rate:  [79-86] 86 (05/16 0454) Resp:  [17-18] 18 (05/16 0454) BP: (121-140)/(77-83) 121/83 mmHg (05/16 0454) SpO2:  [95 %-97 %] 95 % (05/16 0454) Last BM Date: 07/23/15 PO 1380 Voided x 6 BM x 1 Afebrile, VSS H/H is stable Intake/Output from previous day: 05/15 0701 - 05/16 0700 In: 1901.2 [P.O.:1380; I.V.:521.2] Out: -  Intake/Output this shift: Total I/O In: 120 [P.O.:120] Out: -   General appearance: alert, cooperative and no distress GI: soft, sore sites look fine.  no drainage on dressing from JP site.  + BS + BM tolerating diet.  Lab Results:   Recent Labs  07/23/15 0451  WBC 6.1  HGB 7.6*  HCT 22.4*  PLT 185    BMET No results for input(s): NA, K, CL, CO2, GLUCOSE, BUN, CREATININE, CALCIUM in the last 72 hours. PT/INR No results for input(s): LABPROT, INR in the last 72 hours.   Recent Labs Lab 07/18/15 1555 07/19/15 1018  AST 21 16  ALT 17 12*  ALKPHOS 61 43  BILITOT 0.1* 0.2*  PROT 7.0 5.1*  ALBUMIN 4.3 3.2*     Lipase  No results found for: LIPASE   Studies/Results: No results found.  Medications: . enoxaparin (LOVENOX) injection  40 mg Subcutaneous Q24H  . ferrous sulfate  325 mg Oral BID WC  . nicotine  14 mg Transdermal Daily  . pantoprazole  40 mg Oral Daily  . polyethylene glycol  17 g Oral TID AC  . 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  Pull central line.    LOS: 6 days    JENNINGS,WILLARD 07/25/2015 [phone]  Agree with above. Looks good. She had a lot of questions.  I tried to answer the gen surg questions. Will leave to Dr. Charlotta Newtonzan the gyn questions.  Ovidio Kinavid Elvis Laufer, MD, [facility] Pager: [phone] Office phone:  [phone]

## 2015-07-25 NOTE — Progress Notes (Signed)
Discussed discharge summary with patient. Reviewed all medications with patient. Patient received Rx and work note. Patient ready for discharge. 

## 2015-07-25 NOTE — Care Management Note (Signed)
Case Management Note  Patient Details  Name: Brandy Mccormick MRN: [id number] Date of Birth: 10/19/1965  Subjective/Objective:                    Action/Plan:   Expected Discharge Date:             Expected Discharge Plan:  Home/Self Care  In-House Referral:     Discharge planning Services     Post Acute Care Choice:    Choice offered to:     DME Arranged:    DME Agency:     HH Arranged:    HH Agency:     Status of Service:  In process, will continue to follow  Medicare Important Message Given:    Date Medicare IM Given:    Medicare IM give by:    Date Additional Medicare IM Given:    Additional Medicare Important Message give by:     If discussed at Long Length of Stay Meetings, dates discussed:  07-25-15  Additional Comments:  Kingsley PlanWile, Brandy Leatham Marie, RN 07/25/2015, 10:37 AM

## 2015-07-25 NOTE — Progress Notes (Signed)
Refused linen change.Pt stated she was getting discharged today

## 2015-07-26 NOTE — Discharge Summary (Signed)
Physician Discharge Summary  Patient ID: Brandy Mccormick MRN: [id number] DOB/AGE: 1965-12-18 50 y.o.  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 Charlotta Newtonzan Hx of hypertension Post op anemia   Active Problems:   Mesenteric hemorrhage   PROCEDURES:  LAPAROSCOPY DIAGNOSTIC, EXPLORATORY LAPAROTOMY, OVERSEW OF BLEEDING MESENTERIC INJURY, [facility]19/15, [facility]   Hospital Course:  50yo PM female who presents for hysteroscopy, D&C, possible polypectomy due to postmenopausal bleeding. On 05/07/15, she had a "regular" period that lasted for 6 days after not having a period for several years. The bleeding required at least 4-5 pads per days with passage of clots. Prior to this, she had not had a period for over 5 years. Denies significant pelvic or abdominal pain. Reports no other acute complaints. US on 05/22/15: 6.8cm uterus with 6-558mm endometrial stripe. Normal ovaries bilaterally. EMB was attempted in office- unable to performed due to cervical stenosis.   She was admitted by Dr. Charlotta Newtonzan on 07/19/35 for hysteroscopy, D&C, possible polypectomy due to postmenopausal bleeding and cervical stenosis. Pt underwent Hysteroscopy, Dilation and Curettage, myosure polypectomy in the OR. Post op she was having significant abdominal complaints and [facility] concerns of [facility] perforation and potential bowel injury.   She was taken back to the OR by  Dr.Blackman and Dr. Charlotta Newtonzan.  She was found to have uterine perforation, mesenteric hemorrhage and hemoperitoneum. She underwent: Diagnostic laparoscopy, repair of uterine perforation,  conversion to open laparotomy later that afternoon.  She had a significant blood loss and was given crystal and 2 units of PRBC.  Post op she stabilized and had her diet slowly advanced.  She was mobilized and her pain meds were converted to oral forms.  Her bowel function returned and by 07/25/15 she was doing pretty well.  She did have some pink color on toilet tissue after voiding, no new pain or discomfort.  Her H/h was low post op and she was concerned about this.  I rechecked her urine anlong with her H/H.  UA showed some RBC's 6-30/HPF.  H/H was up to 9.2/26.3.  She was seen by Dr. Ezzard StandingNewman and he agreed she was able to go home.  We arranged follow up with our office for staple removal and to see Dr. Magnus IvanBlackman.  She is also to follow up with her PCP and Dr. Charlotta Newtonzan.   We placed her on iron for hemoglobin replacement .  We also recommended cigarette cessation.     CBC Latest Ref Rng 07/25/2015 07/23/2015 07/21/2015  WBC 4.0 - 10.5 K/uL - 6.1 7.1  Hemoglobin 12.0 - 15.0 g/dL 8.4(X9.2(L) 7.6(L) 7.7(L)  Hematocrit 36.0 - 46.0 % 26.3(L) 22.4(L) 23.0(L)  Platelets 150 - 400 K/uL - 185 141(L)   CMP Latest Ref Rng 07/21/2015 07/20/2015 07/19/2015  Glucose 65 - 99 mg/dL 88 324(M118(H) -  BUN 6 - 20 mg/dL <0(N<5(L) <0(U<5(L) -  Creatinine 0.44 - 1.00 mg/dL 7.250.55 3.660.59 -  Sodium 440135 - 145 mmol/L 141 140 138  Potassium 3.5 - 5.1 mmol/L 4.0 4.1 4.1  Chloride 101 - 111 mmol/L 106 108 -  CO2 22 - 32 mmol/L 24 23 -  Calcium 8.9 - 10.3 mg/dL 8.2(L) 7.9(L) -  Total Protein 6.5 - 8.1 g/dL - - -  Total Bilirubin 0.3 - 1.2 mg/dL - - -  Alkaline Phos 38 - 126 U/L - - -  AST 15 - 41 U/L - - -  ALT 14 - 54 U/L - - -    Condition on d/c:  Improving    Disposition: 01-Home or Self Care     Medication List    STOP taking these medications        HYDROcodone-acetaminophen 5-325 MG tablet  Commonly known as:  NORCO/VICODIN  Replaced by:  HYDROcodone-acetaminophen 7.5-325 MG tablet      TAKE these medications        ferrous  sulfate 325 (65 FE) MG tablet  Take 1 tablet (325 mg total) by mouth daily after supper.     HYDROcodone-acetaminophen 7.5-325 MG tablet  Commonly known as:  NORCO  Take 1-2 tablets by mouth every 6 (six) hours as needed for severe pain.     ibuprofen 200 MG tablet  Commonly known as:  ADVIL,MOTRIN  You can take 2-3 tablets every 6 hours as needed for pain.     lisinopril 10 MG tablet  Commonly known as:  PRINIVIL,ZESTRIL  Take 10 mg by mouth daily.     multivitamin-iron-minerals-folic acid chewable tablet  Any woman's multivitamin with iron will work.  Take one daily.     nicotine 14 mg/24hr patch  Commonly known as:  NICODERM CQ - dosed in mg/24 hours  You can buy this at any pharmacy and continue to withdraw from tobacco.  Follow the package instructions.           Follow-up Information    Follow up with Myna Hidalgo, M, DO In 2 weeks.   Specialty:  Obstetrics and Gynecology   Why:  Call and make an appoinment 1-2 weeks.     Contact information:   [address] [place] [address] [phone]       Follow up with [facility] On 07/31/2015.   Specialty:  General Surgery   Why:  Your have an appointment to have your staples removed at 10 AM, be at the office 30 minutes early for check in.   Contact information:   [address] [place] [address] [phone]       Follow up with Shelly Rubenstein, MD On 08/08/2015.   Specialty:  General Surgery   Why:  You have an appointment with Dr. Magnus Ivan at 11 AM, be at the office 30 minutes early for check in.     Contact information:   [address] [place] [address] [phone]       Follow up with [facility], MD.   Specialty:  Family Medicine   Why:  I would call and let her know what has occured, she can follow up on any medical issues.   Contact information:   [address] [place] [address] [phone]        Signed: Sherrie George 07/26/2015, 10:44 AM

## 2015-09-24 ENCOUNTER — Emergency Department (HOSPITAL_COMMUNITY)
Admission: EM | Admit: 2015-09-24 | Discharge: 2015-09-24 | Disposition: A | Discharge status: Home / Self Care | Payer: Managed Care, Other (non HMO) | Attending: Emergency Medicine | Admitting: Emergency Medicine

## 2015-09-24 ENCOUNTER — Encounter (HOSPITAL_COMMUNITY): Payer: Self-pay

## 2015-09-24 ENCOUNTER — Emergency Department (HOSPITAL_COMMUNITY): Payer: Managed Care, Other (non HMO)

## 2015-09-24 DIAGNOSIS — Z79899 Other long term (current) drug therapy: Secondary | ICD-10-CM | POA: Insufficient documentation

## 2015-09-24 DIAGNOSIS — F17210 Nicotine dependence, cigarettes, uncomplicated: Secondary | ICD-10-CM | POA: Diagnosis not present

## 2015-09-24 DIAGNOSIS — R1032 Left lower quadrant pain: Secondary | ICD-10-CM | POA: Insufficient documentation

## 2015-09-24 DIAGNOSIS — I10 Essential (primary) hypertension: Secondary | ICD-10-CM | POA: Insufficient documentation

## 2015-09-24 LAB — URINE MICROSCOPIC-ADD ON: RBC / HPF: NONE SEEN RBC/hpf (ref 0–5)

## 2015-09-24 LAB — CBC WITH DIFFERENTIAL/PLATELET
Basophils Absolute: 0 10*3/uL (ref 0.0–0.1)
Basophils Relative: 1 %
Eosinophils Absolute: 0.1 10*3/uL (ref 0.0–0.7)
Eosinophils Relative: 2 %
HEMATOCRIT: 37.5 % (ref 36.0–46.0)
HEMOGLOBIN: 12.5 g/dL (ref 12.0–15.0)
LYMPHS ABS: 2.4 10*3/uL (ref 0.7–4.0)
LYMPHS PCT: 44 %
MCH: 30 pg (ref 26.0–34.0)
MCHC: 33.3 g/dL (ref 30.0–36.0)
MCV: 89.9 fL (ref 78.0–100.0)
MONOS PCT: 9 %
Monocytes Absolute: 0.5 10*3/uL (ref 0.1–1.0)
NEUTROS ABS: 2.4 10*3/uL (ref 1.7–7.7)
NEUTROS PCT: 44 %
Platelets: 246 10*3/uL (ref 150–400)
RBC: 4.17 MIL/uL (ref 3.87–5.11)
RDW: 13.6 % (ref 11.5–15.5)
WBC: 5.5 10*3/uL (ref 4.0–10.5)

## 2015-09-24 LAB — BASIC METABOLIC PANEL
Anion gap: 7 (ref 5–15)
BUN: 10 mg/dL (ref 6–20)
CHLORIDE: 106 mmol/L (ref 101–111)
CO2: 25 mmol/L (ref 22–32)
CREATININE: 0.65 mg/dL (ref 0.44–1.00)
Calcium: 9.4 mg/dL (ref 8.9–10.3)
GFR calc Af Amer: >60 mL/min (ref >60)
GFR calc non Af Amer: >60 mL/min (ref >60)
Glucose, Bld: 89 mg/dL (ref 65–99)
POTASSIUM: 3.8 mmol/L (ref 3.5–5.1)
Sodium: 138 mmol/L (ref 135–145)

## 2015-09-24 LAB — URINALYSIS, ROUTINE W REFLEX MICROSCOPIC
BILIRUBIN URINE: NEGATIVE
GLUCOSE, UA: NEGATIVE mg/dL
HGB URINE DIPSTICK: NEGATIVE
KETONES UR: NEGATIVE mg/dL
NITRITE: NEGATIVE
PH: 6 (ref 5.0–8.0)
Protein, ur: NEGATIVE mg/dL
Specific Gravity, Urine: 1.011 (ref 1.005–1.030)

## 2015-09-24 MED ORDER — HYDROCODONE-ACETAMINOPHEN 5-325 MG PO TABS
1.0000 | ORAL_TABLET | Freq: Once | ORAL | Status: AC
  Administered 2015-09-24: 1 via ORAL
  Filled 2015-09-24: qty 1

## 2015-09-24 NOTE — ED Notes (Signed)
Patient here with lower abdominal pain for the past week. Had a uterine biopsy with mesenteric tear and repair in may and when getting on float this past weekend felt pulling sensation and now pain has increased.

## 2015-09-24 NOTE — Discharge Instructions (Signed)
Read the information below.  Your labs were re-assuring you did not have an elevated WBC, your hemoglobin was normal. Your electrolytes were normal. No acute abnormalities were noted on your x-ray.  It is important that you rest for the next couple of days. Continue to take tramadol and ibuprofen 600mg  every three hours for pain relief. You can apply warm compresses to the affected area or take warm soaks in the tub. It is important that you call your surgeon's office on Monday to schedule a follow up appointment with them.  You may return to the Emergency Department at any time for worsening condition or any new symptoms that concern you. Return to ED if your symptoms worsen or you develop a fever, inability to keep food and fluids down, blood in urine, or blood in stool.

## 2015-09-24 NOTE — ED Provider Notes (Signed)
CSN: [id number]     Arrival date & time 09/24/15  1339 History   First MD Initiated Contact with Patient 09/24/15 1730     Chief Complaint  Patient presents with  . lower abdominal pain-surgery may      (Consider location/radiation/quality/duration/timing/severity/associated sxs/prior Treatment) HPI Comments: Brandy Mccormick is a 50 y.o. female presents to ED with complaint of LLQ abdominal pain. Patient is s/p mesenteric artery repair in May 2017 following uterine perforation during uterine biopsy. She has had a benign post-operative course. She presents to ED with LLQ abdominal pain x 1 week. She reports she was pulling herself up onto a float when she felt a pulling sensation in her lower abdomen to the left of her surgical incision. The pain has progressively worsened over the last week. Pain is worse with movement and can be uncomfortable to sit. She has tried tramadol and  ibuprofen with transient relief of sxs. She does note some swelling on left side compared to right, but denies warmth or erythema. She has had no change in appetite. She denies fever, nausea, vomiting, or diarrhea. She does endorse constipation; however, contributes that to her medications. She denies blood in stool. No dysuria or hematuria. No vaginal pain, vaginal bleeding, vaginal discharge, or pelvic pain. No shortness of breath or chest pain.   The history is provided by the patient.    Past Medical History  Diagnosis Date  . Depression   . Hypertension   . Arthritis   . Surgical history of tubal ligation 2002   Past Surgical History  Procedure Laterality Date  . Carpal tunnel release Left   . Knee arthroscopy Right 4/17  . Colonoscopy  2017  . Wisdom tooth extraction    . Laparoscopy N/A 07/19/2015    Procedure: LAPAROSCOPY DIAGNOSTIC, EXPLORATORY LAPAROTOMY;  Surgeon: Myna Hidalgo, DO;  Location: [facility] OR;  Service: Gynecology;  Laterality: N/A;  . Dilatation & curettage/hysteroscopy with myosure N/A  07/19/2015    Procedure: DILATATION & CURETTAGE/HYSTEROSCOPY WITH MYOSURE;  Surgeon: Myna Hidalgo, DO;  Location: [facility] ORS;  Service: Gynecology;  Laterality: N/A;  Polypectomy   Family History  Problem Relation Age of Onset  . Cancer Other    Social History  Substance Use Topics  . Smoking status: Current Every Day Smoker -- 1.00 packs/day    Types: Cigarettes  . Smokeless tobacco: Never Used  . Alcohol Use: Yes     Comment: weekends   OB History    No data available     Review of Systems  Gastrointestinal: Positive for abdominal pain and constipation ( secondary to medications).  All other systems reviewed and are negative.     Allergies  Review of patient's allergies indicates no known allergies.  Home Medications   Prior to Admission medications   Medication Sig Start Date End Date Taking? Authorizing Provider  ferrous sulfate 325 (65 FE) MG tablet Take 1 tablet (325 mg total) by mouth daily after supper. 07/25/15   Sherrie George, PA-C  HYDROcodone-acetaminophen (NORCO) 7.5-325 MG tablet Take 1-2 tablets by mouth every 6 (six) hours as needed for severe pain. 07/25/15   Sherrie George, PA-C  ibuprofen (ADVIL,MOTRIN) 200 MG tablet You can take 2-3 tablets every 6 hours as needed for pain. 07/25/15   Sherrie George, PA-C  lisinopril (PRINIVIL,ZESTRIL) 10 MG tablet Take 10 mg by mouth daily.    Historical Provider, MD  multivitamin-iron-minerals-folic acid (CENTRUM) chewable tablet Any woman's multivitamin with iron will work.  Take one daily. 07/25/15  Sherrie GeorgeWillard Jennings, PA-C  nicotine (NICODERM CQ - DOSED IN MG/24 HOURS) 14 mg/24hr patch You can buy this at any pharmacy and continue to withdraw from tobacco.  Follow the package instructions. 07/25/15   Sherrie GeorgeWillard Jennings, PA-C   BP 150/77 mmHg  Pulse 71  Temp(Src) 99.2 F (37.3 C) (Oral)  Resp 16  SpO2 99% Physical Exam  Constitutional: She appears well-developed and well-nourished. No distress.  HENT:  Head:  Normocephalic and atraumatic.  Mouth/Throat: Oropharynx is clear and moist. No oropharyngeal exudate.  Eyes: Conjunctivae and EOM are normal. Pupils are equal, round, and reactive to light. Right eye exhibits no discharge. Left eye exhibits no discharge. No scleral icterus.  Neck: Normal range of motion. Neck supple.  Cardiovascular: Normal rate, regular rhythm, normal heart sounds and intact distal pulses.   No murmur heard. Pulmonary/Chest: Effort normal and breath sounds normal. No respiratory distress.  Abdominal: Soft. Bowel sounds are normal. There is tenderness. There is no rebound and no guarding.  Well healed surgical scar in suprapubic region. TTP of LLQ. Mild swelling noted in LLQ. No warmth or erythema noted. No masses palpated. No appreciable hernia.   Musculoskeletal: Normal range of motion.  Lymphadenopathy:    She has no cervical adenopathy.  Neurological: She is alert. Coordination normal.  Skin: Skin is warm and dry. She is not diaphoretic.  Psychiatric: She has a normal mood and affect. Her behavior is normal.    ED Course  Procedures (including critical care time) Labs Review Labs Reviewed  URINALYSIS, ROUTINE W REFLEX MICROSCOPIC (NOT AT [facility]) - Abnormal; Notable for the following:    APPearance CLOUDY (*)    Leukocytes, UA SMALL (*)    All other components within normal limits  URINE MICROSCOPIC-ADD ON - Abnormal; Notable for the following:    Squamous Epithelial / LPF TOO NUMEROUS TO COUNT (*)    Bacteria, UA MANY (*)    All other components within normal limits  URINE CULTURE  CBC WITH DIFFERENTIAL/PLATELET  BASIC METABOLIC PANEL    Imaging Review Dg Abd 1 View  09/24/2015  CLINICAL DATA:  Left lower quadrant pain for 1 week. EXAM: ABDOMEN - 1 VIEW COMPARISON:  Jul 19, 2015 FINDINGS: The kidneys are largely obscured by bowel contents. No renal stones are identified. No ureteral stones are noted. Calcifications in the right upper quadrant may represent  cholelithiasis but appear to be superior to the right kidney. No other acute abnormalities. Mild fecal loading in the right colon. IMPRESSION: No acute abnormalities identified. Electronically Signed   By: Gerome Samavid  Williams III M.D   On: 09/24/2015 19:55   I have personally reviewed and evaluated these images and lab results as part of my medical decision-making.   EKG Interpretation None     Filed Vitals:   09/24/15 1356 09/24/15 1924 09/24/15 1930 09/24/15 2015  BP: 172/103 103/71 114/69 150/77  Pulse: 92 66 77 71  Temp: 99.2 F (37.3 C)     TempSrc: Oral     Resp: 20 20  16   SpO2: 98% 98% 96% 99%    MDM   Final diagnoses:  Left lower quadrant pain    Patient is afebrile and non-toxic appearing in NAD. Vital signs remarkable for elevated blood pressure and borderline tachypnea, otherwise stable. Physical exam remarkable for TTP of LLQ. Abdomen is soft with positive bowel sounds. No peritoneal signs. No warmth or erythema appreciated. Mild swelling noted. No hernia appreciated. Will check CBC, BMP, and abdominal x-ray r/o obstruction or  perforation. Pain medication given.  CBC and CMP re-assuring. U/A unclear picture with cloudy appearance and leukocytes and bacteria; however, too numerous to count squamous epithelial. Pt asx. Hold off on ABX at this time and culture urine. Abdominal x-ray negative for acute abnormalities. Low suspicion for perforation, obstruction, or diverticulitis, appendicitis, or cholecysitis. Given history, suspect this is MSK in nature.   Patient endorses improvement in pain following pain medication administration. Abdomen remains tender in LLQ. Discussed results with patient. Discussed sxs management to include rest, warm compresses, and pain medication. Encouraged follow up with surgeon this week for re-evaluation. Discussed return precautions. Pt voiced understanding and is agreeable.    Lona Kettle, PA-C 09/25/15 0022  Nelva Nay, MD 09/30/15  1028

## 2015-09-24 NOTE — ED Notes (Signed)
Onset 09-17-15 pt was pulling self up on float and felt pull in abdomen, pain has worsened since.  Painful when sitting and bending over.  Has been taking Tramadol and Ibuprofen and in past several days no relief.  Lower left abd swelling.  Pt reports felt hardened area in LLQ earlier today, abd soft at this time. Surgery 2 months ago for mesenteric tear.

## 2015-09-26 LAB — URINE CULTURE

## 2016-06-22 ENCOUNTER — Emergency Department (HOSPITAL_COMMUNITY)
Admission: EM | Admit: 2016-06-22 | Discharge: 2016-06-22 | Disposition: A | Discharge status: Home / Self Care | Payer: Managed Care, Other (non HMO) | Attending: Emergency Medicine | Admitting: Emergency Medicine

## 2016-06-22 ENCOUNTER — Emergency Department (HOSPITAL_COMMUNITY): Payer: Managed Care, Other (non HMO)

## 2016-06-22 DIAGNOSIS — I10 Essential (primary) hypertension: Secondary | ICD-10-CM | POA: Diagnosis not present

## 2016-06-22 DIAGNOSIS — R079 Chest pain, unspecified: Secondary | ICD-10-CM | POA: Diagnosis present

## 2016-06-22 DIAGNOSIS — F17210 Nicotine dependence, cigarettes, uncomplicated: Secondary | ICD-10-CM | POA: Insufficient documentation

## 2016-06-22 DIAGNOSIS — R0789 Other chest pain: Secondary | ICD-10-CM | POA: Diagnosis not present

## 2016-06-22 DIAGNOSIS — Z720 Tobacco use: Secondary | ICD-10-CM

## 2016-06-22 LAB — BASIC METABOLIC PANEL
ANION GAP: 9 (ref 5–15)
BUN: 9 mg/dL (ref 6–20)
CO2: 24 mmol/L (ref 22–32)
Calcium: 8.8 mg/dL — ABNORMAL LOW (ref 8.9–10.3)
Chloride: 109 mmol/L (ref 101–111)
Creatinine, Ser: 0.65 mg/dL (ref 0.44–1.00)
Glucose, Bld: 87 mg/dL (ref 65–99)
Potassium: 4 mmol/L (ref 3.5–5.1)
SODIUM: 142 mmol/L (ref 135–145)

## 2016-06-22 LAB — CBC
HEMATOCRIT: 45.3 % (ref 36.0–46.0)
HEMOGLOBIN: 15.7 g/dL — AB (ref 12.0–15.0)
MCH: 30.8 pg (ref 26.0–34.0)
MCHC: 34.7 g/dL (ref 30.0–36.0)
MCV: 89 fL (ref 78.0–100.0)
Platelets: 265 10*3/uL (ref 150–400)
RBC: 5.09 MIL/uL (ref 3.87–5.11)
RDW: 13.9 % (ref 11.5–15.5)
WBC: 8.7 10*3/uL (ref 4.0–10.5)

## 2016-06-22 LAB — I-STAT TROPONIN, ED: Troponin i, poc: 0 ng/mL (ref 0.00–0.08)

## 2016-06-22 LAB — ETHANOL: ALCOHOL ETHYL (B): 133 mg/dL — AB (ref <5)

## 2016-06-22 NOTE — ED Provider Notes (Signed)
[facility]-EMERGENCY DEPT Provider Note   CSN: [id number] Arrival date & time: 06/22/16  1900     History   Chief Complaint Chief Complaint  Patient presents with  . Chest Pain    HPI Brandy Mccormick is a 51 y.o. female.  Pt presents to the ED today with chest pain and sob.  The pt said that she could not catch her breath.  She called EMS who gave her asa and 1 nitro.  CP has improved.      Past Medical History:  Diagnosis Date  . Arthritis   . Depression   . Hypertension   . Surgical history of tubal ligation 2002    Patient Active Problem List   Diagnosis Date Noted  . 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 CARPAL TUNNEL RELEASE Left   . COLONOSCOPY  2017  . DILATATION & CURETTAGE/HYSTEROSCOPY WITH MYOSURE N/A 07/19/2015   Procedure: DILATATION & CURETTAGE/HYSTEROSCOPY WITH MYOSURE;  Surgeon: Myna Hidalgo, DO;  Location: [facility];  Service: Gynecology;  Laterality: N/A;  Polypectomy  . KNEE ARTHROSCOPY Right 4/17  . LAPAROSCOPY N/A 07/19/2015   Procedure: LAPAROSCOPY DIAGNOSTIC, EXPLORATORY LAPAROTOMY;  Surgeon: Myna Hidalgo, DO;  Location: [facility];  Service: Gynecology;  Laterality: N/A;  . WISDOM TOOTH EXTRACTION      OB History    No data available       Home Medications    Prior to Admission medications   Medication Sig Start Date End Date Taking? Authorizing Provider  ferrous sulfate 325 (65 FE) MG tablet Take 1 tablet (325 mg total) by mouth daily after supper. 07/25/15   Sherrie George, PA-C  HYDROcodone-acetaminophen (NORCO) 7.5-325 MG tablet Take 1-2 tablets by mouth every 6 (six) hours as needed for severe pain. 07/25/15   Sherrie George, PA-C  ibuprofen (ADVIL,MOTRIN) 200 MG tablet You can take 2-3 tablets every 6 hours as needed for pain. 07/25/15   Sherrie George, PA-C  lisinopril (PRINIVIL,ZESTRIL) 10 MG tablet Take 10 mg by mouth daily.    Historical Provider, MD  multivitamin-iron-minerals-folic  acid (CENTRUM) chewable tablet Any woman's multivitamin with iron will work.  Take one daily. 07/25/15   Sherrie George, PA-C  nicotine (NICODERM CQ - DOSED IN MG/24 HOURS) 14 mg/24hr patch You can buy this at any pharmacy and continue to withdraw from tobacco.  Follow the package instructions. 07/25/15   Sherrie George, PA-C    Family History Family History  Problem Relation Age of Onset  . Cancer Other     Social History Social History  Substance Use Topics  . Smoking status: Current Every Day Smoker    Packs/day: 1.00    Types: Cigarettes  . Smokeless tobacco: Never Used  . Alcohol use Yes     Comment: weekends     Allergies   Patient has no known allergies.   Review of Systems Review of Systems  Respiratory: Positive for shortness of breath.   Cardiovascular: Positive for chest pain.  All other systems reviewed and are negative.    Physical Exam Updated Vital Signs BP 109/74   Pulse 82   Temp 98 F (36.7 C) (Oral)   Resp 14   SpO2 94%   Physical Exam  Constitutional: She is oriented to person, place, and time. She appears well-developed and well-nourished.  HENT:  Head: Normocephalic and atraumatic.  Right Ear: External ear normal.  Left Ear: External ear normal.  Nose: Nose normal.  Mouth/Throat: Oropharynx is  clear and moist.  Eyes: Conjunctivae and EOM are normal. Pupils are equal, round, and reactive to light.  Neck: Normal range of motion. Neck supple.  Cardiovascular: Normal rate, regular rhythm, normal heart sounds and intact distal pulses.   Pulmonary/Chest: Effort normal and breath sounds normal.  Abdominal: Soft. Bowel sounds are normal.  Musculoskeletal: Normal range of motion.  Neurological: She is alert and oriented to person, place, and time.  Skin: Skin is warm.  Psychiatric: She has a normal mood and affect. Her behavior is normal. Judgment and thought content normal.  Nursing note and vitals reviewed.    ED Treatments / Results    Labs (all labs ordered are listed, but only abnormal results are displayed) Labs Reviewed  BASIC METABOLIC PANEL - Abnormal; Notable for the following:       Result Value   Calcium 8.8 (*)    All other components within normal limits  CBC - Abnormal; Notable for the following:    Hemoglobin 15.7 (*)    All other components within normal limits  ETHANOL - Abnormal; Notable for the following:    Alcohol, Ethyl (B) 133 (*)    All other components within normal limits  I-STAT TROPOININ, ED    EKG  EKG Interpretation  Date/Time:  Saturday June 22 2016 19:07:14 EDT Ventricular Rate:  85 PR Interval:    QRS Duration: 110 QT Interval:  394 QTC Calculation: 469 R Axis:   36 Text Interpretation:  Sinus rhythm Confirmed by Particia Nearing MD, [place] Lintner ([id number]) on 06/22/2016 7:16:58 PM       Radiology Dg Chest 2 View  Result Date: 06/22/2016 CLINICAL DATA:  Chest pain EXAM: CHEST  2 VIEW COMPARISON:  07/19/2015 FINDINGS: The heart size and mediastinal contours are within normal limits. Both lungs are clear. The visualized skeletal structures are unremarkable. IMPRESSION: No active cardiopulmonary disease. Electronically Signed   By: Alcide Clever M.D.   On: 06/22/2016 20:05    Procedures Procedures (including critical care time)  Medications Ordered in ED Medications - No data to display   Initial Impression / Assessment and Plan / ED Course  I have reviewed the triage vital signs and the nursing notes.  Pertinent labs & imaging results that were available during my care of the patient were reviewed by me and considered in my medical decision making (see chart for details).    Pt is feeling better.  It sounds like pt may have had a panic attack.  She has a heart score of 2.  She is stable for d/c.  I talked to her about not smoking.   Final Clinical Impressions(s) / ED Diagnoses   Final diagnoses:  Atypical chest pain  Tobacco abuse    New Prescriptions New Prescriptions   No  medications on file     Jacalyn Lefevre, MD 06/22/16 2044

## 2016-06-22 NOTE — ED Triage Notes (Signed)
Pt reports upon standing had chest pain 10/10, felt like she could not catch her breath. 324 Asprin, 1 nitro given. 126/80 BP. NSR in monitor. Hx of HTN.

## 2016-08-16 ENCOUNTER — Encounter (HOSPITAL_COMMUNITY): Payer: Self-pay | Admitting: Emergency Medicine

## 2016-08-16 ENCOUNTER — Emergency Department (HOSPITAL_COMMUNITY)
Admission: EM | Admit: 2016-08-16 | Discharge: 2016-08-17 | Discharge status: Against Medical Advice | Payer: Managed Care, Other (non HMO) | Attending: Emergency Medicine | Admitting: Emergency Medicine

## 2016-08-16 DIAGNOSIS — Z79899 Other long term (current) drug therapy: Secondary | ICD-10-CM | POA: Insufficient documentation

## 2016-08-16 DIAGNOSIS — F17210 Nicotine dependence, cigarettes, uncomplicated: Secondary | ICD-10-CM | POA: Diagnosis not present

## 2016-08-16 DIAGNOSIS — T50901A Poisoning by unspecified drugs, medicaments and biological substances, accidental (unintentional), initial encounter: Secondary | ICD-10-CM | POA: Diagnosis present

## 2016-08-16 DIAGNOSIS — I10 Essential (primary) hypertension: Secondary | ICD-10-CM | POA: Insufficient documentation

## 2016-08-16 DIAGNOSIS — F1910 Other psychoactive substance abuse, uncomplicated: Secondary | ICD-10-CM

## 2016-08-16 DIAGNOSIS — F1110 Opioid abuse, uncomplicated: Secondary | ICD-10-CM | POA: Diagnosis not present

## 2016-08-16 LAB — CBC
HEMATOCRIT: 39.7 % (ref 36.0–46.0)
HEMOGLOBIN: 14 g/dL (ref 12.0–15.0)
MCH: 31.3 pg (ref 26.0–34.0)
MCHC: 35.3 g/dL (ref 30.0–36.0)
MCV: 88.6 fL (ref 78.0–100.0)
Platelets: 234 10*3/uL (ref 150–400)
RBC: 4.48 MIL/uL (ref 3.87–5.11)
RDW: 13.2 % (ref 11.5–15.5)
WBC: 7.8 10*3/uL (ref 4.0–10.5)

## 2016-08-16 LAB — COMPREHENSIVE METABOLIC PANEL
ALBUMIN: 4.1 g/dL (ref 3.5–5.0)
ALT: 16 U/L (ref 14–54)
ANION GAP: 12 (ref 5–15)
AST: 18 U/L (ref 15–41)
Alkaline Phosphatase: 71 U/L (ref 38–126)
BUN: 12 mg/dL (ref 6–20)
CHLORIDE: 105 mmol/L (ref 101–111)
CO2: 23 mmol/L (ref 22–32)
Calcium: 8.5 mg/dL — ABNORMAL LOW (ref 8.9–10.3)
Creatinine, Ser: 0.69 mg/dL (ref 0.44–1.00)
GFR calc Af Amer: >60 mL/min (ref >60)
GFR calc non Af Amer: >60 mL/min (ref >60)
GLUCOSE: 105 mg/dL — AB (ref 65–99)
POTASSIUM: 3.4 mmol/L — AB (ref 3.5–5.1)
SODIUM: 140 mmol/L (ref 135–145)
Total Bilirubin: 0.2 mg/dL — ABNORMAL LOW (ref 0.3–1.2)
Total Protein: 7.1 g/dL (ref 6.5–8.1)

## 2016-08-16 LAB — ACETAMINOPHEN LEVEL

## 2016-08-16 LAB — SALICYLATE LEVEL: Salicylate Lvl: <7 mg/dL (ref 2.8–30.0)

## 2016-08-16 LAB — ETHANOL: Alcohol, Ethyl (B): 134 mg/dL — ABNORMAL HIGH (ref <5)

## 2016-08-16 MED ORDER — SODIUM CHLORIDE 0.9 % IV BOLUS (SEPSIS)
1000.0000 mL | Freq: Once | INTRAVENOUS | Status: AC
  Administered 2016-08-16: 1000 mL via INTRAVENOUS

## 2016-08-16 NOTE — ED Notes (Signed)
Bed: [id number] Expected date:  Expected time:  Means of arrival:  Comments: OD

## 2016-08-16 NOTE — ED Provider Notes (Signed)
[facility]-EMERGENCY DEPT Provider Note   CSN: [id number] Arrival date & time: 08/16/16  2158     History   Chief Complaint Chief Complaint  Patient presents with  . Drug Overdose    HPI Brandy Mccormick is a 51 y.o. female.  HPI   Pt is a 51 yo female with PMH of HTN, arthritis and depression presents to the ED via EMS from home with reported episode of drug overdose. Pt states she took a friend's "pain pill" earlier this evening and then went to and had 3 beers. She reports once they got home from dinner she walked out to sit on her porch to smoke and states the next thing she remembers is waking up with firefighter's standing around her. Pt's fiance at bedside states their roommate found the pt unresponsive laying on the ground on the porch. Pt's fiance states she had a pulse but had shallow breaths so they called EMS and started CPR. Fiance states EMS gave narcan and then the pt come to. Pt denies any pain or complaints at this time. She denies knowing what the medication was that she took but denies any other drug use. Denies fever, headache, neck/back pain, visual changes, lightheadedness, CP, SOB, palpitations, abdominal pain, N/V/D, urinary or bowel incontinence, numbness, tingling, weakness, seizure like activity.   Past Medical History:  Diagnosis Date  . Arthritis   . Depression   . Hypertension   . Surgical history of tubal ligation 2002    Patient Active Problem List   Diagnosis Date Noted  . 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 CARPAL TUNNEL RELEASE Left   . COLONOSCOPY  2017  . DILATATION & CURETTAGE/HYSTEROSCOPY WITH MYOSURE N/A 07/19/2015   Procedure: DILATATION & CURETTAGE/HYSTEROSCOPY WITH MYOSURE;  Surgeon: Myna Hidalgo, DO;  Location: [facility];  Service: Gynecology;  Laterality: N/A;  Polypectomy  . KNEE ARTHROSCOPY Right 4/17  . LAPAROSCOPY N/A 07/19/2015   Procedure: LAPAROSCOPY DIAGNOSTIC, EXPLORATORY  LAPAROTOMY;  Surgeon: Myna Hidalgo, DO;  Location: [facility];  Service: Gynecology;  Laterality: N/A;  . WISDOM TOOTH EXTRACTION      OB History    No data available       Home Medications    Prior to Admission medications   Medication Sig Start Date End Date Taking? Authorizing Provider  lisinopril (PRINIVIL,ZESTRIL) 10 MG tablet Take 10 mg by mouth daily.   Yes [provider]    Family History Family History  Problem Relation Age of Onset  . Cancer Other     Social History Social History  Substance Use Topics  . Smoking status: Current Every Day Smoker    Packs/day: 1.00    Types: Cigarettes  . Smokeless tobacco: Never Used  . Alcohol use Yes     Comment: weekends     Allergies   Patient has no known allergies.   Review of Systems Review of Systems  All other systems reviewed and are negative.    Physical Exam Updated Vital Signs BP 119/79 (BP Location: Left Arm)   Pulse 75   Temp 98.3 F (36.8 C) (Oral)   Resp 16   SpO2 95%   Physical Exam  Constitutional: She is oriented to person, place, and time. She appears well-developed and well-nourished.  Pt appears mildly anxious during exam  HENT:  Head: Normocephalic and atraumatic. Head is without raccoon's eyes, without Battle's sign, without abrasion and without laceration.  Right Ear: Tympanic membrane normal.  No hemotympanum.  Left Ear: Tympanic membrane normal. No hemotympanum.  Nose: Nose normal. No sinus tenderness, nasal deformity, septal deviation or nasal septal hematoma. No epistaxis.  Mouth/Throat: Uvula is midline, oropharynx is clear and moist and mucous membranes are normal. No oropharyngeal exudate, posterior oropharyngeal edema, posterior oropharyngeal erythema or tonsillar abscesses.  Eyes: Conjunctivae and EOM are normal. Pupils are equal, round, and reactive to light. Right eye exhibits no discharge. Left eye exhibits no discharge. No scleral icterus.  Neck: Normal range of  motion. Neck supple.  Cardiovascular: Normal rate, regular rhythm, normal heart sounds and intact distal pulses.   Pulmonary/Chest: Effort normal and breath sounds normal. No respiratory distress. She has no wheezes. She has no rales. She exhibits no tenderness.  Abdominal: Soft. Bowel sounds are normal. She exhibits no distension and no mass. There is no tenderness. There is no rebound and no guarding. No hernia.  Musculoskeletal: Normal range of motion. She exhibits no edema or deformity.  No cervical, thoracic, or lumbar spine midline TTP. Full ROM of bilateral upper and lower extremities with 5/5 strength.   2+ radial and PT pulses. Sensation grossly intact.   Neurological: She is alert and oriented to person, place, and time. She has normal strength. No cranial nerve deficit or sensory deficit. She displays a negative Romberg sign. Coordination and gait normal. GCS eye subscore is 4. GCS verbal subscore is 5. GCS motor subscore is 6.  Skin: Skin is warm and dry.  Nursing note and vitals reviewed.    ED Treatments / Results  Labs (all labs ordered are listed, but only abnormal results are displayed) Labs Reviewed  COMPREHENSIVE METABOLIC PANEL - Abnormal; Notable for the following:       Result Value   Potassium 3.4 (*)    Glucose, Bld 105 (*)    Calcium 8.5 (*)    Total Bilirubin 0.2 (*)    All other components within normal limits  ETHANOL - Abnormal; Notable for the following:    Alcohol, Ethyl (B) 134 (*)    All other components within normal limits  ACETAMINOPHEN LEVEL - Abnormal; Notable for the following:    Acetaminophen (Tylenol), Serum <10 (*)    All other components within normal limits  RAPID URINE DRUG SCREEN, HOSP PERFORMED - Abnormal; Notable for the following:    Opiates POSITIVE (*)    All other components within normal limits  SALICYLATE LEVEL  CBC    EKG  EKG Interpretation  Date/Time:  Friday August 16 2016 22:15:38 EDT Ventricular Rate:  94 PR  Interval:    QRS Duration: 85 QT Interval:  383 QTC Calculation: 479 R Axis:   27 Text Interpretation:  Sinus rhythm Baseline wander in lead(s) V6 No significant change since last tracing Confirmed by Alvira MondaySchlossman, Erin ([id number]) on 08/16/2016 11:45:28 PM       Radiology No results found.  Procedures Procedures (including critical care time)  Medications Ordered in ED Medications  sodium chloride 0.9 % bolus 1,000 mL (0 mLs Intravenous Stopped 08/17/16 0022)     Initial Impression / Assessment and Plan / ED Course  I have reviewed the triage vital signs and the nursing notes.  Pertinent labs & imaging results that were available during my care of the patient were reviewed by me and considered in my medical decision making (see chart for details).     Patient presents via EMS with drug overdose. She reports taking an unknown pain medication earlier this evening and then drinking 3  beers at dinner. Patient was found unresponsive by her roommate with pulse present and shallow breathing. Patient became alert and oriented after EMS administered Narcan prior to arrival. VSS. On exam patient appears mildly anxious. Exam otherwise unremarkable. No neuro deficits. Patient is alert and oriented 4. Labs revealed EtOH 134. UDS positive for opiates. Remaining labs unremarkable. EKG showed sinus rhythm with no significant changes from prior. Discussed pt with Dr. Dalene Seltzer, plan to observe pt in the ED for 6 hours due to pt taking unknown pain medication. Discussed results and plan with pt. Pt reports she needs to leave within the next hour to go home to care for her dogs. I discussed with pt concern for her leaving due to unknown short vs long term acting pain medication. Pt reports understanding but continues to report wanting to leave AMA. Pt left AMA. Pt has remained A&Ox4 and hemodynamically stable since arrival to the ED.   Final Clinical Impressions(s) / ED Diagnoses   Final diagnoses:    Accidental drug overdose, initial encounter  Polysubstance abuse    New Prescriptions New Prescriptions   No medications on file     Barrett Henle, Cordelia Poche 08/17/16 Garret Reddish, MD 08/17/16 1415

## 2016-08-16 NOTE — ED Triage Notes (Signed)
Pt BIB EMS with drug overdose. Patient states she took a little blue pill. No SI. Narcan 1mg  IN given by fire. When fire arrived, CPR in progress with RR of 5. EMS assisted ventilation, cap was 51-54. No injuries. Patient remembers laying down outside and nothing after that.

## 2016-08-17 LAB — RAPID URINE DRUG SCREEN, HOSP PERFORMED
Amphetamines: NOT DETECTED
BENZODIAZEPINES: NOT DETECTED
Barbiturates: NOT DETECTED
Cocaine: NOT DETECTED
Opiates: POSITIVE — AB
Tetrahydrocannabinol: NOT DETECTED

## 2019-06-07 ENCOUNTER — Ambulatory Visit: Payer: Managed Care, Other (non HMO) | Attending: Internal Medicine

## 2019-06-07 DIAGNOSIS — Z23 Encounter for immunization: Secondary | ICD-10-CM

## 2019-06-07 NOTE — Progress Notes (Signed)
   Covid-19 Vaccination Clinic  Name:  Brandy Mccormick    MRN: [id number] DOB: Apr 02, 1965  06/07/2019  Brandy Mccormick was observed post Covid-19 immunization for 15 minutes without incident. She was provided with Vaccine Information Sheet and instruction to access the V-Safe system.   Brandy Mccormick was instructed to call 911 with any severe reactions post vaccine: Marland Kitchen Difficulty breathing  . Swelling of face and throat  . A fast heartbeat  . A bad rash all over body  . Dizziness and weakness   Immunizations Administered    Name Date Dose VIS Date Route   JANSSEN COVID-19 VACCINE 06/07/2019  1:39 PM 0.5 mL 05/08/2019 Intramuscular   Manufacturer: [employer]   Lot: [id number]   NDC: [phone]

## 2019-10-14 ENCOUNTER — Emergency Department (HOSPITAL_COMMUNITY): Payer: Self-pay

## 2019-10-14 ENCOUNTER — Emergency Department (HOSPITAL_COMMUNITY)
Admission: EM | Admit: 2019-10-14 | Discharge: 2019-10-14 | Disposition: A | Discharge status: Home / Self Care | Payer: Self-pay | Attending: Emergency Medicine | Admitting: Emergency Medicine

## 2019-10-14 ENCOUNTER — Other Ambulatory Visit: Payer: Self-pay

## 2019-10-14 ENCOUNTER — Encounter (HOSPITAL_COMMUNITY): Payer: Self-pay | Admitting: Emergency Medicine

## 2019-10-14 DIAGNOSIS — F17210 Nicotine dependence, cigarettes, uncomplicated: Secondary | ICD-10-CM | POA: Insufficient documentation

## 2019-10-14 DIAGNOSIS — S0011XA Contusion of right eyelid and periocular area, initial encounter: Secondary | ICD-10-CM | POA: Insufficient documentation

## 2019-10-14 DIAGNOSIS — Y929 Unspecified place or not applicable: Secondary | ICD-10-CM | POA: Insufficient documentation

## 2019-10-14 DIAGNOSIS — I10 Essential (primary) hypertension: Secondary | ICD-10-CM | POA: Insufficient documentation

## 2019-10-14 DIAGNOSIS — W19XXXA Unspecified fall, initial encounter: Secondary | ICD-10-CM

## 2019-10-14 DIAGNOSIS — Y939 Activity, unspecified: Secondary | ICD-10-CM | POA: Insufficient documentation

## 2019-10-14 DIAGNOSIS — W0110XA Fall on same level from slipping, tripping and stumbling with subsequent striking against unspecified object, initial encounter: Secondary | ICD-10-CM | POA: Insufficient documentation

## 2019-10-14 DIAGNOSIS — T148XXA Other injury of unspecified body region, initial encounter: Secondary | ICD-10-CM

## 2019-10-14 DIAGNOSIS — S0990XA Unspecified injury of head, initial encounter: Secondary | ICD-10-CM

## 2019-10-14 DIAGNOSIS — Y999 Unspecified external cause status: Secondary | ICD-10-CM | POA: Insufficient documentation

## 2019-10-14 DIAGNOSIS — M25532 Pain in left wrist: Secondary | ICD-10-CM | POA: Insufficient documentation

## 2019-10-14 NOTE — ED Triage Notes (Signed)
Patient reports trip and fall outside today hitting head on concrete. Hematoma to right forehead. Denies LOC. Denies taking blood thinners. C/o worsening pain to right wrist. Brace on wrist PTA. Reports existing injury from previous fall.

## 2019-10-14 NOTE — Discharge Instructions (Signed)
As we discussed, your imaging today looked reassuring.  You have a small hematoma on your face.  This will likely cause some more bruising that will begin traveling her face.  This is following gravity.  As we discussed, your neck image showed no evidence of acute bony abnormality but did show some stenosis that could be pressing on the nerve and contributing to your pain.  I given you referral to neurosurgery.  Follow-up with [facility] to establish a primary care doctor if you do not have one.   Return the emergency department for vomiting, difficulty moving your arms or legs, difficulty walking or any other worsening concerning symptoms.

## 2019-10-14 NOTE — ED Provider Notes (Signed)
[facility]-EMERGENCY DEPT Provider Note   CSN: [id number] Arrival date & time: 10/14/19  2122     History Chief Complaint  Patient presents with  . Fall  . Head Injury    Brandy Mccormick is a 54 y.o. female past medical history of hypertension, arthritis who presents for evaluation of mechanical fall.  Patient reports that at about 7 PM this evening, she was walking.  She had flip-flops on at the time and states that she tripped over the flip-flops, causing her to fall.  She states that she landed forward.  She states that she hit her left wrist which she had previously had issues with.  She also reports hitting her head and right side of her face.  No LOC.  She was able to get up and ambulate.  She reports that she has had ongoing pain in the left wrist for the last months.  She states that falling on it feels like it worsened the pain.  She does report pain around her right eye from where she fell as well as some neck pain that began after mechanical fall. She states that she was at home and her aunt wanted her to go to the emergency department to get checked.  Patient states she is not on blood thinners.  She has not had any vision changes, nausea/vomiting, numbness/weakness of her arms or legs.    The history is provided by the patient.       Past Medical History:  Diagnosis Date  . Arthritis   . Depression   . Hypertension   . Surgical history of tubal ligation 2002    Patient Active Problem List   Diagnosis Date Noted  . 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 CARPAL TUNNEL RELEASE Left   . COLONOSCOPY  2017  . DILATATION & CURETTAGE/HYSTEROSCOPY WITH MYOSURE N/A 07/19/2015   Procedure: DILATATION & CURETTAGE/HYSTEROSCOPY WITH MYOSURE;  Surgeon: Myna Hidalgo, DO;  Location: [facility];  Service: Gynecology;  Laterality: N/A;  Polypectomy  . KNEE ARTHROSCOPY Right 4/17  . LAPAROSCOPY N/A 07/19/2015    Procedure: LAPAROSCOPY DIAGNOSTIC, EXPLORATORY LAPAROTOMY;  Surgeon: Myna Hidalgo, DO;  Location: [facility] OR;  Service: Gynecology;  Laterality: N/A;  . WISDOM TOOTH EXTRACTION       OB History   No obstetric history on file.     Family History  Problem Relation Age of Onset  . Cancer Other     Social History   Tobacco Use  . Smoking status: Current Every Day Smoker    Packs/day: 1.00    Types: Cigarettes  . Smokeless tobacco: Never Used  Substance Use Topics  . Alcohol use: Yes    Comment: weekends  . Drug use: No    Home Medications Prior to Admission medications   Medication Sig Start Date End Date Taking? Authorizing Provider  lisinopril (PRINIVIL,ZESTRIL) 10 MG tablet Take 10 mg by mouth daily.    [provider]    Allergies    Patient has no known allergies.  Review of Systems   Review of Systems  HENT:       Facial pain  Eyes: Negative for visual disturbance.  Cardiovascular: Negative for chest pain.  Gastrointestinal: Negative for abdominal pain, nausea and vomiting.  Musculoskeletal:       Wrist pain  Neurological: Positive for headaches. Negative for weakness and numbness.  All other systems reviewed and are negative.   Physical Exam  Updated Vital Signs BP (!) 152/92 (BP Location: Right Arm)   Pulse 79   Temp 98.3 F (36.8 C) (Oral)   Resp 15   Ht 5\' 3"  (1.6 m)   Wt 70.3 kg   SpO2 94%   BMI 27.46 kg/m   Physical Exam Vitals and nursing note reviewed.  Constitutional:      Appearance: Normal appearance. She is well-developed.  HENT:     Head: Normocephalic and atraumatic.      Comments: No tenderness noted to the skull.  No underlying skull deformity or crepitus noted. Eyes:     General: Lids are normal.     Conjunctiva/sclera: Conjunctivae normal.     Pupils: Pupils are equal, round, and reactive to light.     Comments: PERRL. No nystagmus. No neglect.  Tenderness palpation noted to the superior periorbital region of the right  eye with overlying ecchymosis and swelling.  EOMs are intact but she does report some pain when she looks laterally.  No signs of entrapment.  No hyphema, hypopyon.  Neck:     Comments: Midline tenderness noted to the C-spine.  No deformity or crepitus noted.  Full range of motion without any difficulty. Cardiovascular:     Rate and Rhythm: Normal rate and regular rhythm.     Pulses: Normal pulses.     Heart sounds: Normal heart sounds. No murmur heard.  No friction rub. No gallop.   Pulmonary:     Effort: Pulmonary effort is normal.     Breath sounds: Normal breath sounds.  Abdominal:     Palpations: Abdomen is soft. Abdomen is not rigid.     Tenderness: There is no abdominal tenderness. There is no guarding.  Musculoskeletal:        General: Normal range of motion.     Comments: Brace in place on the left wrist.  No deformity or crepitus noted.  Limited range of motion secondary to pain.  Diffuse tenderness palpation noted.  She is wiggling all 5 digits.  Skin:    General: Skin is warm and dry.     Capillary Refill: Capillary refill takes less than 2 seconds.  Neurological:     Mental Status: She is alert and oriented to person, place, and time.     Comments: Cranial nerves III-XII intact Follows commands, Moves all extremities  5/5 strength to BUE and BLE  Sensation intact throughout all major nerve distributions No gait abnormalities  No slurred speech. No facial droop.   Psychiatric:        Speech: Speech normal.     ED Results / Procedures / Treatments   Labs (all labs ordered are listed, but only abnormal results are displayed) Labs Reviewed - No data to display  EKG None  Radiology DG Wrist Complete Left  Result Date: 10/14/2019 CLINICAL DATA:  Pain status post fall EXAM: LEFT WRIST - COMPLETE 3+ VIEW COMPARISON:  None. FINDINGS: There is no evidence of fracture or dislocation. There is no evidence of arthropathy or other focal bone abnormality. Soft tissues are  unremarkable. IMPRESSION: Negative. Electronically Signed   By: 12/14/2019 M.D.   On: 10/14/2019 22:17   CT Cervical Spine Wo Contrast  Result Date: 10/14/2019 CLINICAL DATA:  Fall EXAM: CT CERVICAL SPINE WITHOUT CONTRAST TECHNIQUE: Multidetector CT imaging of the cervical spine was performed without intravenous contrast. Multiplanar CT image reconstructions were also generated. COMPARISON:  None. FINDINGS: Alignment: No static subluxation. Facets are aligned. Occipital condyles and the lateral masses  of C1 and C2 are normally approximated. Skull base and vertebrae: No acute fracture. Soft tissues and spinal canal: No prevertebral fluid or swelling. No visible canal hematoma. Disc levels: There is severe left facet hypertrophy at C2-5. This causes stenosis of the left C3 and C4 neural foramina. Upper chest: No pneumothorax, pulmonary nodule or pleural effusion. Other: Normal visualized paraspinal cervical soft tissues. IMPRESSION: 1. No acute fracture or static subluxation of the cervical spine. 2. 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 Electronically Signed   By: Deatra RobinsonKevin  Herman M.D.   On: 10/14/2019 23:21   CT Orbits Wo Contrast  Result Date: 10/14/2019 CLINICAL DATA:  Fall EXAM: CT ORBITS WITHOUT CONTRAST TECHNIQUE: Multidetector CT images were obtained using the standard protocol without intravenous contrast. COMPARISON:  None. FINDINGS: Orbits: No traumatic or inflammatory finding. Globes, optic nerves, orbital fat, extraocular muscles, vascular structures, and lacrimal glands are normal. Visualized sinuses: Clear Soft tissues: There is a small right supraorbital scalp hematoma. Limited intracranial: There is a small left ambient cistern lipoma. IMPRESSION: Small right supraorbital scalp hematoma without orbital injury. Electronically Signed   By: Deatra RobinsonKevin  Herman M.D.   On: 10/14/2019 23:19    Procedures Procedures (including critical Mccormick  time)  Medications Ordered in ED Medications - No data to display  ED Course  I have reviewed the triage vital signs and the nursing notes.  Pertinent labs & imaging results that were available during my Mccormick of the patient were reviewed by me and considered in my medical decision making (see chart for details).    MDM Rules/Calculators/A&P                          54 year old female who presents for evaluation of mechanical fall that occurred at 7 PM.  She reports hitting her face.  No LOC.  She is not on blood thinners.  She denies any vision changes, numbness/weakness.  She does report worsening pain in her left wrist.  On initial arrival, she is afebrile, nontoxic-appearing.  Vital signs are stable.  On exam, she does have ecchymosis, tenderness in the periorbital region on the right side.  EOMs are intact but does report some pain with lateral movement.  X-ray of wrist ordered at triage.  We will plan for image of orbital region to assess for any orbital fracture and C-spine imaging.  At this time, with no neurological neuro deficits, no blood thinner use, no LOC. Given reassuring physical exam and [facility], no imaging is indicated at this time.  I did discuss with patient and offered her head CT.  We discussed risk first benefits and engaged in shared decision making.  Patient wished to decline.  We will plan for imaging of her orbits and C-spine.  Wrist x-ray negative.  CT orbit shows no acute traumatic or inflammatory finding.  No evidence of orbital fracture.  There is a small right supraorbital scalp hematoma.  CT cervical spine shows no evidence of acute fracture.  She does have severe left C2-C5 facet arthrosis resulting in stenosis.  I discussed with patient.  She has had ongoing arm pain for the last several months.  She states mostly it is in her wrist and she has been dealing with it for several months.  She does report occasionally she will have some sharp  pain that radiates down her neck.  Question if this is radiculopathy  in nature.  We will plan to give her outpatient neurosurgery referral. At this time, patient exhibits no emergent life-threatening condition that require further evaluation in ED or admission. Patient had ample opportunity for questions and discussion. All patient's questions were answered with full understanding. Strict return precautions discussed. Patient expresses understanding and agreement to plan.   Portions of this note were generated with Scientist, clinical (histocompatibility and immunogenetics). Dictation errors may occur despite best attempts at proofreading.   Final Clinical Impression(s) / ED Diagnoses Final diagnoses:  Fall, initial encounter  Minor head injury, initial encounter  Hematoma  Left wrist pain    Rx / DC Orders ED Discharge Orders    None       Rosana Hoes 10/14/19 2336    Gwyneth Sprout, MD 10/15/19 1552

## 2021-03-05 IMAGING — CT CT ORBITS W/O CM
3 series · 15 of 47 positions shown, 18 images · non-contrast
Comparison: None.

CLINICAL DATA: Fall

EXAM:
CT ORBITS WITHOUT CONTRAST
TECHNIQUE: Multidetector CT images were obtained using the standard protocol
without intravenous contrast.

[Series 504: orbits 2.0 h30s st · axial · 0.32mm/px · z∈[-172,-86]mm · 9 of 51 slices shown, 12 images]
[im 4/51  brain]
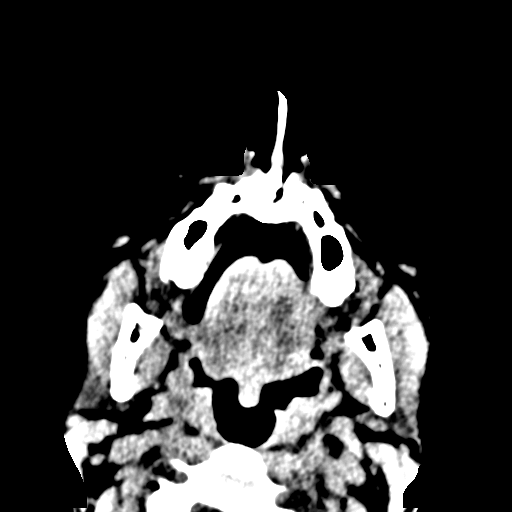
[im 4/51  bone]
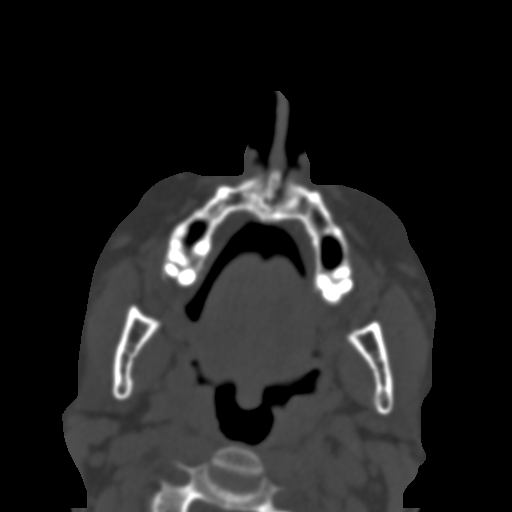
[im 9/51  bone]
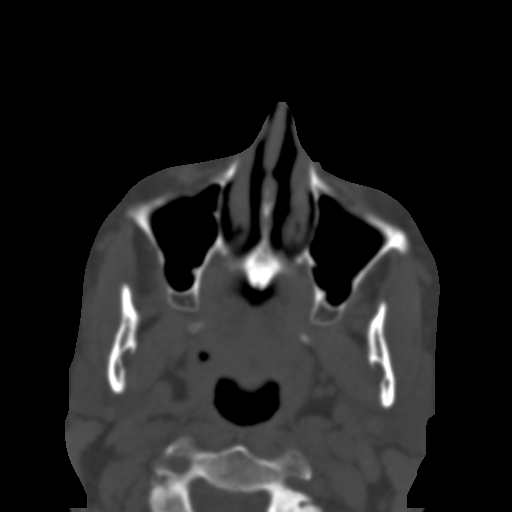
[im 14/51  bone]
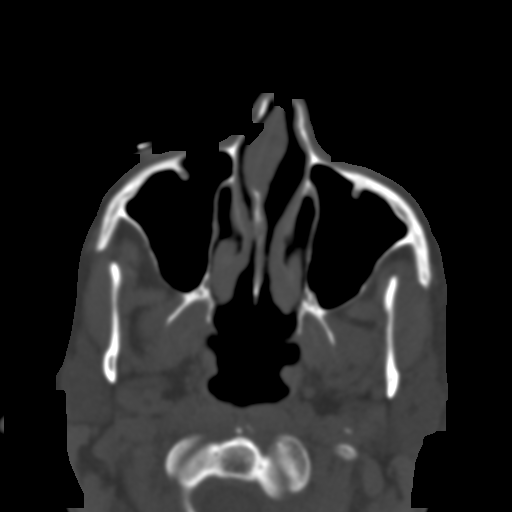
[im 19/51  bone]
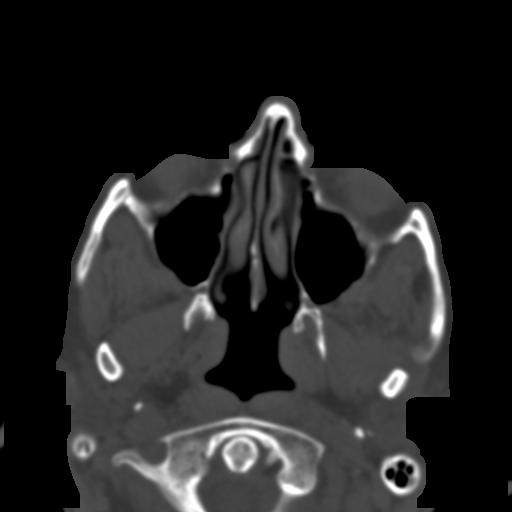
[im 26/51  brain]
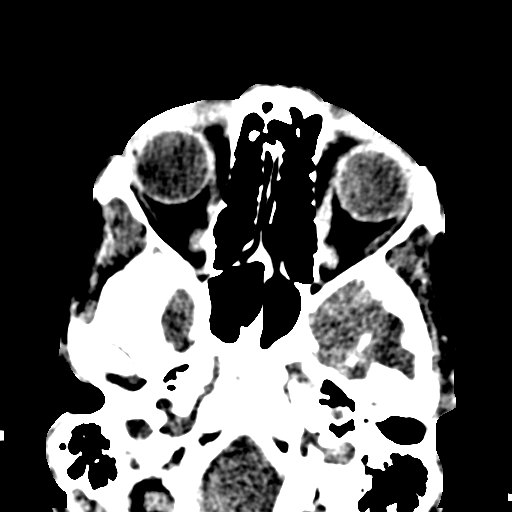
[im 26/51  bone]
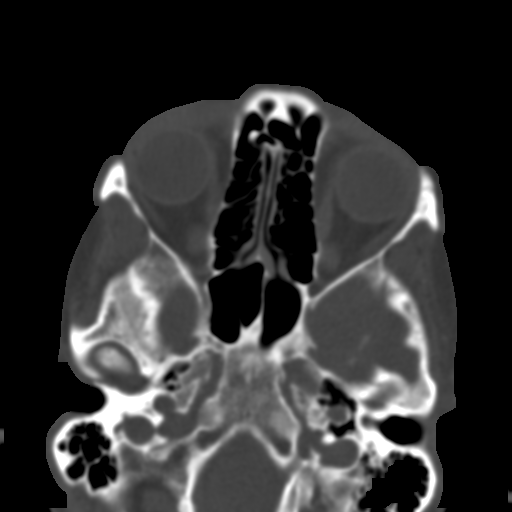
[im 32/51  bone]
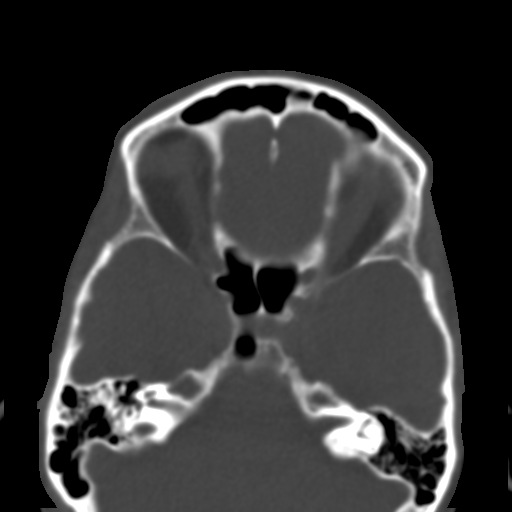
[im 37/51  bone]
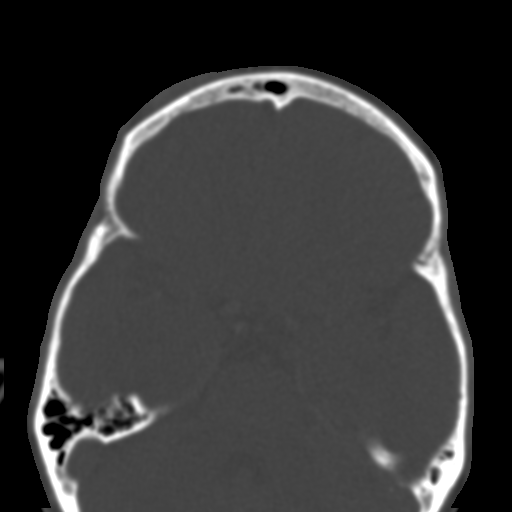
[im 42/51  bone]
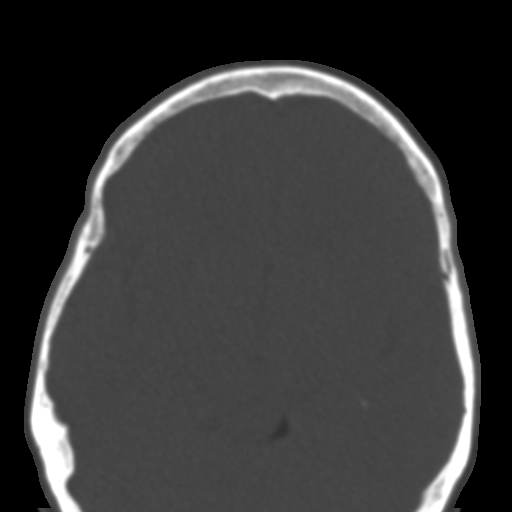
[im 47/51  brain]
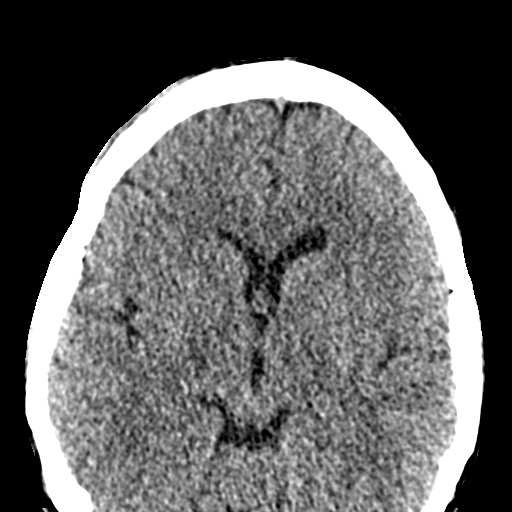
[im 47/51  bone]
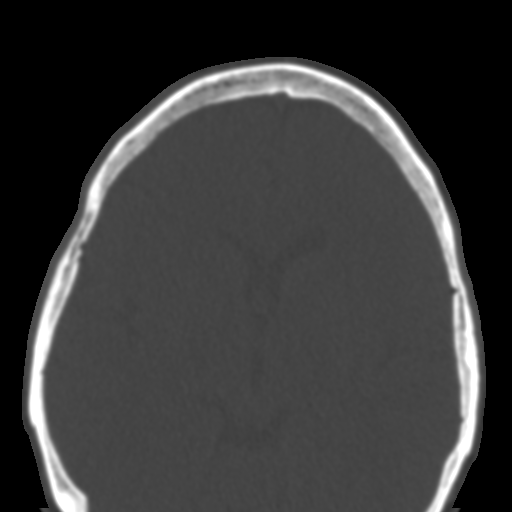

[Series 507: orbits st coronal · coronal · 0.29mm/px · 3 of 79 slices shown]
[im 27/79  bone]
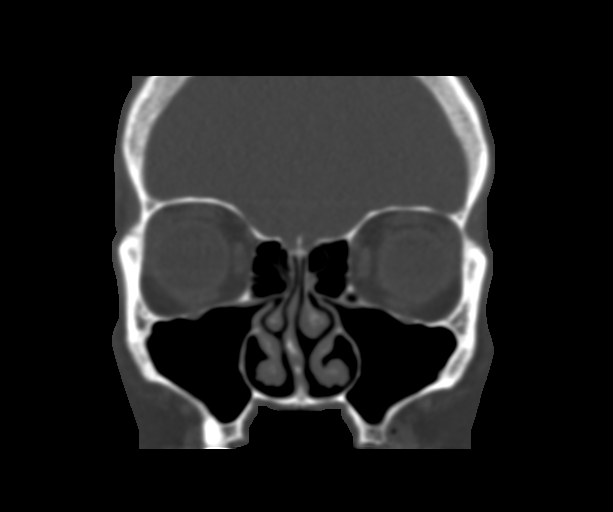
[im 35/79  bone]
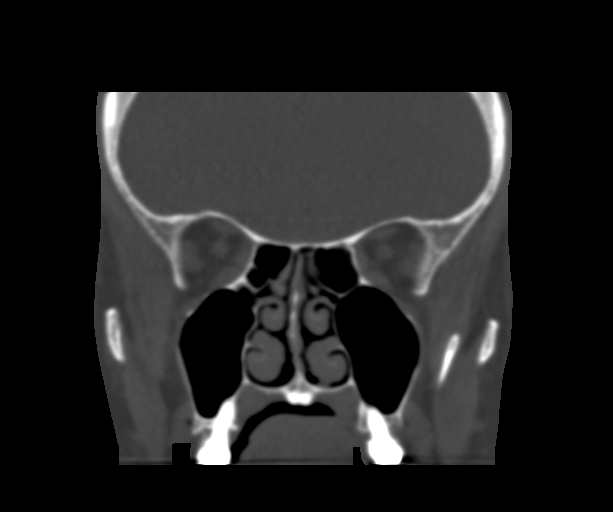
[im 44/79  bone]
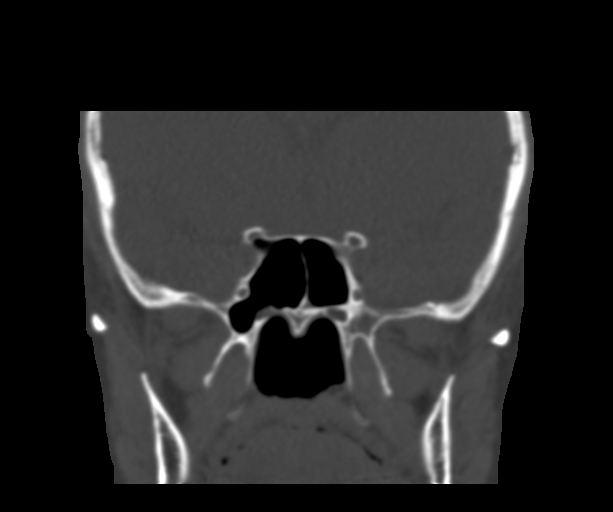

[Series 508: orbits st sagittal · sagittal · 0.23mm/px · 3 of 77 slices shown]
[im 26/77  bone]
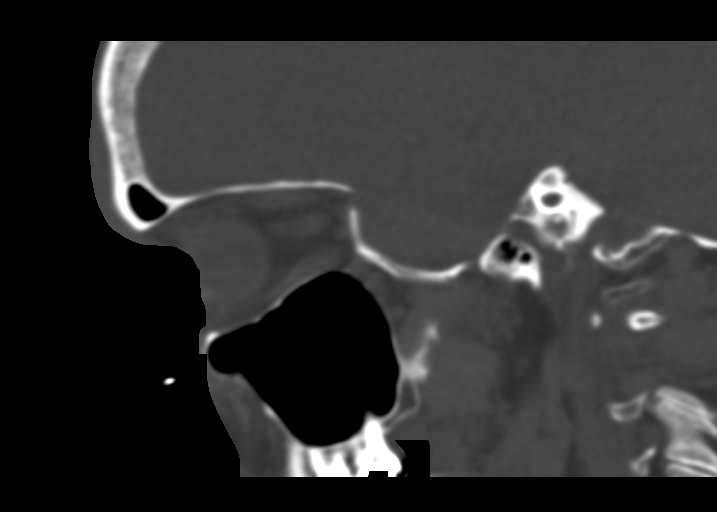
[im 39/77  bone]
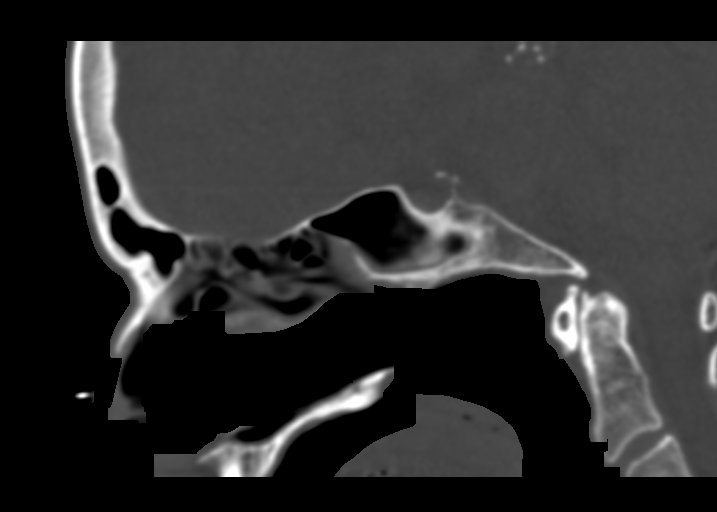
[im 51/77  bone]
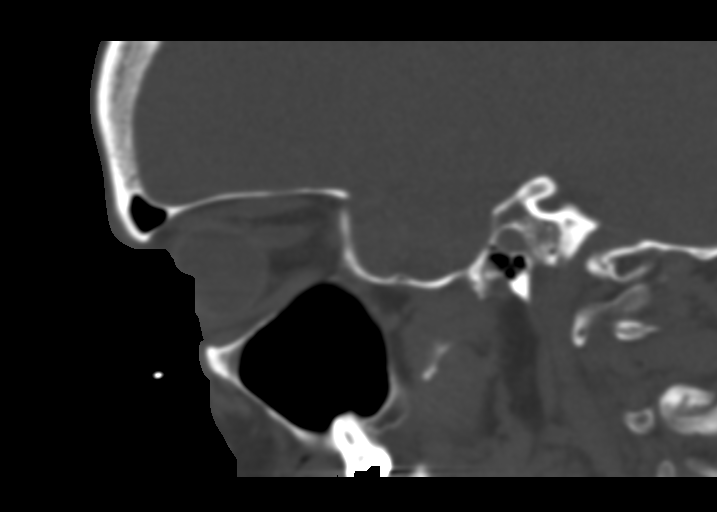

[15 of 47 positions shown; findings below may reference images not displayed]

FINDINGS: Orbits: No traumatic or inflammatory finding. Globes, optic nerves,
orbital fat, extraocular muscles, vascular structures, and lacrimal
glands are normal.

Visualized sinuses: Clear

Soft tissues: There is a small right supraorbital scalp hematoma.

Limited intracranial: There is a small left ambient cistern lipoma.
IMPRESSION: Small right supraorbital scalp hematoma without orbital injury.

## 2021-08-09 ENCOUNTER — Telehealth: Payer: Self-pay

## 2021-08-09 NOTE — Telephone Encounter (Signed)
NOTES SCANNED TO REFERRAL 

## 2021-08-18 NOTE — Progress Notes (Unsigned)
Cardiology Office Note   Date:  08/22/2021   ID:  Brandy Mccormick, DOB 12/14/1965, MRN [id number]  PCP:  Moshe Cipro, NP  Cardiologist:   Manasi Dishon [place], MD   Chief Complaint  Patient presents with   Heart Murmur      History of Present Illness: Brandy Mccormick is a 57 y.o. female who is seen at the request of Moshe Cipro NP for evaluation of chest pain and HTN.  She has a history of HTN. History of tobacco abuse. No prior cardiac evaluation. She states that periodically she has chest pain radiating to her jaw. This is a grabbing sensation. At other times she has acute SOB associated with chest pain that occurs with periods of stress or exertion. She states she was just started on BP med a month ago. She didn't have insurance before but does now. 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 Not very healthy. Smokes 1 PPD. Mother has severe CAD and PAD. No claudication.     Past Medical History:  Diagnosis Date   Arthritis    Depression    Hypertension    Surgical history of tubal ligation 2002    Past Surgical History:  Procedure Laterality Date   CARPAL TUNNEL RELEASE Left    COLONOSCOPY  2017   DILATATION & CURETTAGE/HYSTEROSCOPY WITH MYOSURE N/A 07/19/2015   Procedure: DILATATION & CURETTAGE/HYSTEROSCOPY WITH MYOSURE;  Surgeon: Myna Hidalgo, DO;  Location: [facility];  Service: Gynecology;  Laterality: N/A;  Polypectomy   KNEE ARTHROSCOPY Right 4/17   LAPAROSCOPY N/A 07/19/2015   Procedure: LAPAROSCOPY DIAGNOSTIC, EXPLORATORY LAPAROTOMY;  Surgeon: Myna Hidalgo, DO;  Location: [facility] OR;  Service: Gynecology;  Laterality: N/A;   WISDOM TOOTH EXTRACTION       Current Outpatient Medications  Medication Sig Dispense Refill   chlorthalidone (HYGROTON) 25 MG tablet Take 1 tablet (25 mg total) by mouth daily. 90 tablet 3   lisinopril (ZESTRIL) 20 MG tablet Take 1 tablet (20 mg total) by mouth daily. 90 tablet 3   meloxicam (MOBIC) 15 MG tablet Take 15 mg by mouth daily.      No current facility-administered medications for this visit.    Allergies:   Patient has no known allergies.    Social History:  The patient  reports that she has been smoking cigarettes. She has been smoking an average of 1 pack per day. She has never used smokeless tobacco. She reports current alcohol use. She reports that she does not use drugs.   Family History:  The patient's family history includes Arrhythmia in her father; Cancer in an other family member; Heart attack in her mother; Peripheral Artery Disease in her mother.    ROS:  Please see the history of present illness.   Otherwise, review of systems are positive for none.   All other systems are reviewed and negative.    PHYSICAL EXAM: VS:  BP (!) 165/102 (BP Location: Left Arm, Patient Position: Sitting, Cuff Size: Normal)   Pulse 66   Ht 5\' 3"  (1.6 m)   Wt 164 lb 3.2 oz (74.5 kg)   SpO2 97%   BMI 29.09 kg/m  , BMI Body mass index is 29.09 kg/m. GEN: Well nourished, well developed, in no acute distress HEENT: normal Neck: no JVD, carotid bruits, or masses Cardiac: RRR; soft 1/6 systolic murmur RUSB, rubs, or gallops,no edema  Respiratory:  clear to auscultation bilaterally, normal work of breathing GI: soft, nontender, nondistended, + BS MS: no  deformity or atrophy Skin: warm and dry, no rash Neuro:  Strength and sensation are intact Psych: euthymic mood, full affect   EKG:  EKG is ordered today. The ekg ordered today demonstrates NSR rate 66. Normal. I have personally reviewed and interpreted this study.    Recent Labs: No results found for requested labs within last 365 days.   Dated 08/08/21: Normal CBC and CMET  Lipid Panel No results found for: "CHOL", "TRIG", "HDL", "CHOLHDL", "VLDL", "LDLCALC", "LDLDIRECT"    Wt Readings from Last 3 Encounters:  08/22/21 164 lb 3.2 oz (74.5 kg)  10/14/19 155 lb (70.3 kg)  07/21/15 155 lb 3.3 oz (70.4 kg)      Other studies Reviewed: Additional studies/  records that were reviewed today include: none. Review of the above records demonstrates: N/A   ASSESSMENT AND PLAN:  1.  Chest pain and dyspnea. Certainly concerning for angina with multiple risk factors. Recommend checking lipid panel. Counseled on smoking cessation. Needs ischemic evaluation. Discussed stress testing vs CTA. I would favor coronary CTA. Will schedule. She is in agreement.  2. HTN poorly controlled. Will increase lisinopril to 20 mg daily. Add chlorthalidone 25 mg daily. Recommend sodium restriction. Encourage increased aerobic activity. 3. Lipid status unknown. Will check lipid panel. Discussed heathy diet along lines of Mediterranean or DASH diet. Eliminate fast/processed foods. 4. Family history of CAD 5. Tobacco abuse - recommend smoking cessation 6. Cardiac murmur. Exam c/w AV sclerosis. Will monitor for now.    Current medicines are reviewed at length with the patient today.  The patient does not have concerns regarding medicines.  The following changes have been made:  see above  Labs/ tests ordered today include:   Orders Placed This Encounter  Procedures   EKG 12-Lead   Coronary CTA Lipid panel      Disposition:   FU with APP after above studies.   Signed, Sybil Shrader [place], MD  08/22/2021 9:20 AM    [facility] [address], [place], [place], [address] Phone [phone], Fax [phone]

## 2021-08-22 ENCOUNTER — Other Ambulatory Visit (HOSPITAL_COMMUNITY): Payer: Self-pay | Admitting: Emergency Medicine

## 2021-08-22 ENCOUNTER — Encounter: Payer: Self-pay | Admitting: Cardiology

## 2021-08-22 ENCOUNTER — Ambulatory Visit: Payer: 59 | Admitting: Cardiology

## 2021-08-22 VITALS — BP 165/102 | HR 66 | Ht 63.0 in | Wt 164.2 lb

## 2021-08-22 DIAGNOSIS — R079 Chest pain, unspecified: Secondary | ICD-10-CM | POA: Diagnosis not present

## 2021-08-22 DIAGNOSIS — I10 Essential (primary) hypertension: Secondary | ICD-10-CM

## 2021-08-22 DIAGNOSIS — R011 Cardiac murmur, unspecified: Secondary | ICD-10-CM | POA: Diagnosis not present

## 2021-08-22 LAB — LIPID PANEL
Chol/HDL Ratio: 2.3 ratio (ref 0.0–4.4)
Cholesterol, Total: 227 mg/dL — ABNORMAL HIGH (ref 100–199)
HDL: 99 mg/dL (ref >39)
LDL Chol Calc (NIH): 113 mg/dL — ABNORMAL HIGH (ref 0–99)
Triglycerides: 86 mg/dL (ref 0–149)
VLDL Cholesterol Cal: 15 mg/dL (ref 5–40)

## 2021-08-22 MED ORDER — CHLORTHALIDONE 25 MG PO TABS: 25.0000 mg | ORAL_TABLET | Freq: Every day | ORAL | 3 refills | Status: DC

## 2021-08-22 MED ORDER — LISINOPRIL 20 MG PO TABS: 20.0000 mg | ORAL_TABLET | Freq: Every day | ORAL | 3 refills | Status: DC

## 2021-08-22 MED ORDER — METOPROLOL TARTRATE 50 MG PO TABS: 100.0000 mg | ORAL_TABLET | Freq: Once | ORAL | 0 refills | Status: DC

## 2021-08-22 NOTE — Patient Instructions (Addendum)
Medication Instructions:    Increase Lisinopril 20 mg    Start taking  Chlorthalidone 25 mg  one tablet daily    *If you need a refill on your cardiac medications before your next appointment, please call your pharmacy*   Lab Work:  Lipid fasting   If you have labs (blood work) drawn today and your tests are completely normal, you will receive your results only by: MyChart Message (if you have MyChart) OR A paper copy in the mail If you have any lab test that is abnormal or we need to change your treatment, we will call you to review the results.   Testing/Procedures: Your physician has requested that you have coronary  CTA. Cardiac computed tomography (CT) is a painless test that uses an x-ray machine to take clear, detailed pictures of your heart arteries. Please follow instruction sheet as given.     Follow-Up: At [facility], you and your health needs are our priority.  As part of our continuing mission to provide you with exceptional heart care, we have created designated Provider Care Teams.  These Care Teams include your primary Cardiologist (physician) and Advanced Practice Providers (APPs -  Physician Assistants and Nurse Practitioners) who all work together to provide you with the care you need, when you need it.     Your next appointment:   2 month(s)  The format for your next appointment:   In Person  Provider:   Peter [place], MD or App   Other Instructions   Blood pressure  reading  keep a log and bring  with you to next appointment  Your physician discussed the hazards of tobacco use. Tobacco use cessation is recommended and techniques and options to help you quit were discussed.       Your cardiac CT will be scheduled at  the below location:   [facility] [address] [place], [place] [address] [phone]    If scheduled at [facility], please arrive at the [facility]'s Entrance (Entrance C2) of [facility] 30 minutes prior to test start time. You can use the FREE valet parking offered at entrance C (encouraged to control the heart rate for the test)  Proceed to the [facility] Radiology Department (first floor) to check-in and test prep.  All radiology patients and guests should use entrance C2 at [facility], accessed from [facility], even though the hospital's physical address listed is [address].      Please follow these instructions carefully (unless otherwise directed):   No labs  needed  On the Night Before the Test: Be sure to Drink plenty of water. Do not consume any caffeinated/decaffeinated beverages or chocolate 12 hours prior to your test. Do not take any antihistamines 12 hours prior to your test.   On the Day of the Test: Drink plenty of water until 1 hour prior to the test. Do not eat any food 4 hours prior to the test. You may take your regular medications prior to the test.  Take metoprolol (Lopressor) 100 mg  two hours prior to test. FEMALES- please wear underwire-free bra if available, avoid dresses & tight clothing            After the Test: Drink plenty of water. After receiving IV contrast, you may experience a mild flushed feeling. This is normal. On occasion, you may experience a mild rash up to 24 hours after the test. This is  not dangerous. If this occurs, you can take Benadryl 25 mg and increase your fluid intake. If you experience trouble breathing, this can be serious. If it is severe call 911 IMMEDIATELY. If it is mild, please call our office.   We will call to schedule your test 2-4 weeks out understanding that some insurance companies will need an authorization prior to the service being performed.   For non-scheduling related questions, please contact the cardiac imaging nurse navigator should you have any questions/concerns: Rockwell Alexandria, Cardiac Imaging Nurse Navigator Larey Brick, Cardiac Imaging  Nurse Navigator [facility] Direct Office Dial: [phone]   For scheduling needs, including cancellations and rescheduling, please call [place], [phone].

## 2021-09-05 ENCOUNTER — Ambulatory Visit: Payer: 59 | Admitting: Orthopaedic Surgery

## 2021-09-12 ENCOUNTER — Telehealth (HOSPITAL_COMMUNITY): Payer: Self-pay | Admitting: *Deleted

## 2021-09-12 NOTE — Telephone Encounter (Signed)
Reaching out to patient to offer assistance regarding upcoming cardiac imaging study; pt verbalizes understanding of appt date/time, parking situation and where to check in, pre-test NPO status and medications ordered, and verified current allergies; name and call back number provided for further questions should they arise  Boy Delamater RN Navigator Cardiac Imaging [facility] [phone] office [phone] cell  Patient to take 50mg metoprolol tartrate two hours prior to her cardiac CT scan. She is aware to arrive at 8am. 

## 2021-09-14 ENCOUNTER — Ambulatory Visit (HOSPITAL_COMMUNITY)
Admission: RE | Admit: 2021-09-14 | Discharge: 2021-09-14 | Disposition: A | Discharge status: Home / Self Care | Payer: 59 | Source: Ambulatory Visit | Attending: Cardiology | Admitting: Cardiology

## 2021-09-14 DIAGNOSIS — I10 Essential (primary) hypertension: Secondary | ICD-10-CM | POA: Diagnosis present

## 2021-09-14 DIAGNOSIS — R079 Chest pain, unspecified: Secondary | ICD-10-CM

## 2021-09-14 DIAGNOSIS — R011 Cardiac murmur, unspecified: Secondary | ICD-10-CM

## 2021-09-14 MED ORDER — NITROGLYCERIN 0.4 MG SL SUBL
SUBLINGUAL_TABLET | SUBLINGUAL | Status: AC
  Filled 2021-09-14: qty 2

## 2021-09-14 MED ORDER — NITROGLYCERIN 0.4 MG SL SUBL
0.8000 mg | SUBLINGUAL_TABLET | Freq: Once | SUBLINGUAL | Status: AC
  Administered 2021-09-14: 0.8 mg via SUBLINGUAL

## 2021-09-14 MED ORDER — IOHEXOL 350 MG/ML SOLN
100.0000 mL | Freq: Once | INTRAVENOUS | Status: AC | PRN
  Administered 2021-09-14: 100 mL via INTRAVENOUS

## 2021-09-21 ENCOUNTER — Ambulatory Visit: Payer: 59 | Admitting: Surgery

## 2021-09-21 ENCOUNTER — Ambulatory Visit: Payer: Self-pay

## 2021-09-21 DIAGNOSIS — M542 Cervicalgia: Secondary | ICD-10-CM

## 2021-09-21 DIAGNOSIS — M47816 Spondylosis without myelopathy or radiculopathy, lumbar region: Secondary | ICD-10-CM

## 2021-09-21 DIAGNOSIS — M4722 Other spondylosis with radiculopathy, cervical region: Secondary | ICD-10-CM

## 2021-09-21 DIAGNOSIS — M545 Low back pain, unspecified: Secondary | ICD-10-CM | POA: Diagnosis not present

## 2021-09-21 MED ORDER — METHOCARBAMOL 500 MG PO TABS: 500.0000 mg | ORAL_TABLET | Freq: Three times a day (TID) | ORAL | 0 refills | Status: DC | PRN

## 2021-09-21 MED ORDER — METHYLPREDNISOLONE 4 MG PO TABS: ORAL_TABLET | ORAL | 0 refills | Status: DC

## 2021-09-21 NOTE — Progress Notes (Signed)
x

## 2021-09-21 NOTE — Progress Notes (Signed)
Office Visit Note   Patient: Brandy Mccormick           Date of Birth: Apr 03, 1965           MRN: [id number] Visit Date: 09/21/2021              Requested by: Dartha Lodge, FNP [address] [place],  [place] [address] PCP: Moshe Cipro, NP   Assessment & Plan: Visit Diagnoses:  1. Neck pain   2. Low back pain, unspecified back pain laterality, unspecified chronicity, unspecified whether sciatica present   3. Other spondylosis with radiculopathy, cervical region   4. Spondylosis without myelopathy or radiculopathy, lumbar region     Plan: At this point recommend conservative treatment.  We will send patient to formal PT.  They can evaluate and treat as needed.  I sent in prescriptions for Medrol Dosepak 6-day taper to be taken as directed and Robaxin as directed for spasms.  Follow-up me in 4 weeks for recheck.  I will make decision at that time as to whether or not MRI scans are needed.  Follow-Up Instructions: Return in about 4 weeks (around 10/19/2021) for With [facility] recheck neck and back pain.   Orders:  Orders Placed This Encounter  Procedures   XR Cervical Spine 2 or 3 views   XR Lumbar Spine 2-3 Views   Ambulatory referral to Physical Therapy   Meds ordered this encounter  Medications   methylPREDNISolone (MEDROL) 4 MG tablet    Sig: 6 day taper to be taken as directed.    Dispense:  21 tablet    Refill:  0   methocarbamol (ROBAXIN) 500 MG tablet    Sig: Take 1 tablet (500 mg total) by mouth every 8 (eight) hours as needed for muscle spasms.    Dispense:  40 tablet    Refill:  0      Procedures: No procedures performed   Clinical Data: No additional findings.   Subjective: Chief Complaint  Patient presents with   Neck - Pain   Lower Back - Pain    HPI 56 year old white female who is new patient to clinic comes in today with complaints of chronic neck pain and low back pain.  Patient states that currently neck symptoms are worse.  She describes  neck pain that radiates into both shoulders and down to the left shoulder blade.  She has right greater than left upper extremity numbness and tingling.  Low back pain is more central and across the low back.  No complaints of lower extremity radiculopathy.  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  States that this aggravates her neck and back symptoms.  Both issues ongoing for several years.  States that she has never really had this evaluated because she has not had any insurance.  Just recently got established with a primary care provider after not having one for at least 5 years.  She did have a cervical spine CT scan that was done August for 2021 when she presented to the [facility] long emergency department after a fall and that study showed:  CLINICAL DATA:  Fall   EXAM: CT CERVICAL SPINE WITHOUT CONTRAST   TECHNIQUE: Multidetector CT imaging of the cervical spine was performed without intravenous contrast. Multiplanar CT image reconstructions were also generated.   COMPARISON:  None.   FINDINGS: Alignment: No static subluxation. Facets are aligned. Occipital condyles and the lateral masses of C1 and C2 are normally approximated.  Skull base and vertebrae: No acute fracture.   Soft tissues and spinal canal: No prevertebral fluid or swelling. No visible canal hematoma.   Disc levels: There is severe left facet hypertrophy at C2-5. This causes stenosis of the left C3 and C4 neural foramina.   Upper chest: No pneumothorax, pulmonary nodule or pleural effusion.   Other: Normal visualized paraspinal cervical soft tissues.   IMPRESSION: 1. No acute fracture or static subluxation of the cervical spine. 2. 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     Electronically Signed   By: Deatra Robinson M.D.   On: 10/14/2019 23:21   Review of Systems No current cardiopulmonary GI/GU issues  Objective: Vital Signs:  There were no vitals taken for this visit.  Physical Exam HENT:     Head: Normocephalic and atraumatic.     Nose: Nose normal.  Eyes:     Extraocular Movements: Extraocular movements intact.  Pulmonary:     Effort: No respiratory distress.  Musculoskeletal:     Comments: Cervical spine patient has good range of motion but with some soreness.  Positive Spurling test.  She does have some relief of left scapular pain with cervical distraction.  Positive left greater than right brachial plexus and trapezius tenderness.  Positive left scapular tenderness.  Bilateral shoulder exam unremarkable.  Bilateral elbows good range of motion.  She does have positive Tinel's over the right cubital tunnel.  Positive Tinel's bilateral carpal tunnel.  Lumbar spine mild bilateral lumbar paraspinal tenderness.  Positive bilateral sciatic notch tenderness.  Negative logroll bilateral hips.  Pain left straight leg raise.  No focal motor deficits throughout.  Neurological:     Mental Status: She is alert.  Psychiatric:        Mood and Affect: Mood normal.     Ortho Exam  Specialty Comments:  No specialty comments available.  Imaging: No results found.   PMFS History: 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   Past Medical History:  Diagnosis Date   Arthritis    Depression    Hypertension    Surgical history of tubal ligation 2002    Family History  Problem Relation Age of Onset   Heart attack Mother    Peripheral Artery Disease Mother    Arrhythmia Father    Cancer Other     Past Surgical History:  Procedure Laterality Date   CARPAL TUNNEL RELEASE Left    COLONOSCOPY  2017   DILATATION & CURETTAGE/HYSTEROSCOPY WITH MYOSURE N/A 07/19/2015   Procedure: DILATATION & CURETTAGE/HYSTEROSCOPY WITH MYOSURE;  Surgeon: Myna Hidalgo, DO;  Location: [facility];  Service: Gynecology;  Laterality: N/A;  Polypectomy   KNEE ARTHROSCOPY Right 4/17   LAPAROSCOPY  N/A 07/19/2015   Procedure: LAPAROSCOPY DIAGNOSTIC, EXPLORATORY LAPAROTOMY;  Surgeon: Myna Hidalgo, DO;  Location: [facility];  Service: Gynecology;  Laterality: N/A;   WISDOM TOOTH EXTRACTION     Social History   Occupational History   Not on file  Tobacco Use   Smoking status: Every Day    Packs/day: 1.00    Types: Cigarettes   Smokeless tobacco: Never  Substance and Sexual Activity   Alcohol use: Yes    Comment: weekends   Drug use: No   Sexual activity: Yes    Birth control/protection: Post-menopausal

## 2021-10-11 NOTE — Therapy (Signed)
OUTPATIENT PHYSICAL THERAPY CERVICAL EVALUATION   Patient Name: Brandy Mccormick MRN: [id number] DOB:1965-09-14, 56 y.o., female Today's Date: 10/12/2021   PT End of Session - 10/12/21 0929     Visit Number 1    Number of Visits 6    Date for PT Re-Evaluation 11/23/21    Authorization Type [employer]    PT Start Time 0915    PT Stop Time 1000    PT Time Calculation (min) 45 min    Activity Tolerance Patient tolerated treatment well;Patient limited by pain    Behavior During Therapy [facility] for tasks assessed/performed;Anxious             Past Medical History:  Diagnosis Date   Arthritis    Depression    Hypertension    Surgical history of tubal ligation 2002   Past Surgical History:  Procedure Laterality Date   CARPAL TUNNEL RELEASE Left    COLONOSCOPY  2017   DILATATION & CURETTAGE/HYSTEROSCOPY WITH MYOSURE N/A 07/19/2015   Procedure: DILATATION & CURETTAGE/HYSTEROSCOPY WITH MYOSURE;  Surgeon: Myna Hidalgo, DO;  Location: [facility];  Service: Gynecology;  Laterality: N/A;  Polypectomy   KNEE ARTHROSCOPY Right 4/17   LAPAROSCOPY N/A 07/19/2015   Procedure: LAPAROSCOPY DIAGNOSTIC, EXPLORATORY LAPAROTOMY;  Surgeon: Myna Hidalgo, DO;  Location: [facility];  Service: Gynecology;  Laterality: N/A;   WISDOM TOOTH EXTRACTION     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     Rationale for Evaluation and Treatment Rehabilitation  ONSET DATE: chronic  SUBJECTIVE:                                                                                                                                                                                                          SUBJECTIVE STATEMENT: Chronic neck pain due to spinal stenosis, present for years.  Also describes cramping in hands  PERTINENT HISTORY:  56 year old white female who is new patient to clinic comes in today with complaints of chronic neck pain and low back pain.  Patient states that currently neck symptoms are worse.  She describes neck pain that radiates into both shoulders and down to the left shoulder blade.  She has right greater than left upper extremity numbness and tingling.  Low back pain is  more central and across the low back.  No complaints of lower extremity radiculopathy.  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  States that this aggravates her neck and back symptoms.  Both issues ongoing for several years.  States that she has never really had this evaluated because she has not had any insurance.  Just recently got established with a primary care provider after not having one for at least 5 years.  She did have a cervical spine CT scan that was done August for 2021 when she presented to the [facility] long emergency department after a fall and that study showed:   PAIN:  Are you having pain? Yes: NPRS scale: 10/10 Pain location: neck and upper back Pain description: ache Aggravating factors: prolonged positioning, driving  Relieving factors: position changes  PRECAUTIONS: None  WEIGHT BEARING RESTRICTIONS No  FALLS:  Has patient fallen in last 6 months? No  LIVING ENVIRONMENT: Lives with: lives with their family Lives in: House/apartment Stairs:  yes Has following equipment at home: None  OCCUPATION: courier  PLOF: Independent  PATIENT GOALS To reduce and mange my symptoms   OBJECTIVE:   DIAGNOSTIC FINDINGS:    EXAM: CT CERVICAL SPINE WITHOUT CONTRAST   TECHNIQUE: Multidetector CT imaging of the cervical spine was performed without intravenous contrast. Multiplanar CT image reconstructions were also generated.   COMPARISON:  None.    FINDINGS: Alignment: No static subluxation. Facets are aligned. Occipital condyles and the lateral masses of C1 and C2 are normally approximated.   Skull base and vertebrae: No acute fracture.   Soft tissues and spinal canal: No prevertebral fluid or swelling. No visible canal hematoma.   Disc levels: There is severe left facet hypertrophy at C2-5. This causes stenosis of the left C3 and C4 neural foramina.   Upper chest: No pneumothorax, pulmonary nodule or pleural effusion.   Other: Normal visualized paraspinal cervical soft tissues.   IMPRESSION: 1. No acute fracture or static subluxation of the cervical spine. 2. 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     Electronically Signed   By: Deatra Robinson M.D.   On: 10/14/2019 23:21  PATIENT SURVEYS:  NDI   COGNITION: Overall cognitive status: Within functional limits for tasks assessed   SENSATION: Not tested  POSTURE: rounded shoulders, forward head, and decreased cervical curvature  PALPATION: Global tenderness/tightness throughout    CERVICAL ROM:   Active ROM A/PROM (deg) eval  Flexion 90%  Extension 50%  Right lateral flexion 25%  Left lateral flexion 25%  Right rotation 90%  Left rotation 50%   (Blank rows = not tested)  UPPER EXTREMITY ROM: WNL  Active ROM Right eval Left eval  Shoulder flexion    Shoulder extension    Shoulder abduction    Shoulder adduction    Shoulder extension    Shoulder internal rotation    Shoulder external rotation    Elbow flexion    Elbow extension    Wrist flexion    Wrist extension    Wrist ulnar deviation    Wrist radial deviation    Wrist pronation    Wrist supination       UPPER EXTREMITY MMT:  MMT Right eval Left eval  Shoulder flexion 5 5  Shoulder extension 5 5  Shoulder abduction 5 5  Shoulder adduction    Shoulder extension    Shoulder internal rotation 5 5  Shoulder external rotation  5 5  Middle trapezius    Lower trapezius    Elbow flexion 5 5  Elbow extension 5 5  Wrist flexion 5 5  Wrist extension 5 5  Wrist ulnar deviation    Wrist radial deviation    Wrist pronation    Wrist supination    Grip strength 66# 72#   (Blank rows = not tested)  CERVICAL SPECIAL TESTS:  Cranial cervical flexion test: Negative   FUNCTIONAL TESTS:  N/a   TODAY'S TREATMENT:  Eval   PATIENT EDUCATION:  Education details: Discussed eval findings, rehab rationale and POC and patient is in agreement  Person educated: Patient Education method: Explanation Education comprehension: verbalized understanding and needs further education   HOME EXERCISE PROGRAM: Access Code: [email or web address] URL: [email or web address] Date: 10/12/2021 Prepared by: Gustavus Bryant  Exercises - Doorway Pec Stretch at 90 Degrees Abduction  - 2 x daily - 7 x weekly - 1 sets - 3 reps - 30s hold - Supine Cervical Retraction with Towel  - 2 x daily - 7 x weekly - 3 sets - 10 reps - 3s hold  ASSESSMENT:  CLINICAL IMPRESSION: Patient is a 56 y.o. female who was seen today for physical therapy evaluation and treatment for chronic neck pain. No neuro signs noted, UE ROM and strength WNL, cervical mobility limited by soft tissue restrictions.   OBJECTIVE IMPAIRMENTS decreased activity tolerance, decreased knowledge of condition, decreased mobility, decreased ROM, hypomobility, increased fascial restrictions, impaired flexibility, impaired UE functional use, and pain.   ACTIVITY LIMITATIONS carrying, lifting, sitting, and standing  PARTICIPATION LIMITATIONS: driving and occupation  PERSONAL FACTORS Fitness, Profession, and Time since onset of injury/illness/exacerbation are also affecting patient's functional outcome.   REHAB POTENTIAL: Good  CLINICAL DECISION MAKING: Stable/uncomplicated  EVALUATION COMPLEXITY: Moderate   GOALS: Goals reviewed with patient? No  SHORT TERM GOALS: Target  date: 11/02/2021   Patient to demonstrate independence in HEP  Baseline: [id number] Goal status: INITIAL  2.  Decrease pain to 6/10 when driving Baseline: 9/38 when driving Goal status: INITIAL    LONG TERM GOALS: Target date: 11/23/2021  Increase cervical SB and L rotation to 75% Baseline:  Active ROM A/PROM (deg) eval  Flexion 90%  Extension 50%  Right lateral flexion 25%  Left lateral flexion 25%  Right rotation 90%  Left rotation 50%   Goal status: INITIAL  2.  Decrease ODI to 10/50 Baseline: 16/50 Goal status: INITIAL  3.  Decrease average pain to 4/10 Baseline: 6/10 average pain Goal status: INITIAL  4.  Patient able to demo neutral posture when cued Baseline: fwd head and rounded shoulder posture Goal status: INITIAL     PLAN: PT FREQUENCY: 1-2x/week  PT DURATION: 6 weeks  PLANNED INTERVENTIONS: Therapeutic exercises, Therapeutic activity, Neuromuscular re-education, Balance training, Gait training, Patient/Family education, Self Care, Joint mobilization, Joint manipulation, Dry Needling, Manual therapy, and Re-evaluation  PLAN FOR NEXT SESSION: HEP f/u, postural training, flexibility and mobility tasks   Hildred Laser, PT 10/12/2021, 11:47 AM

## 2021-10-12 ENCOUNTER — Ambulatory Visit: Payer: 59 | Attending: Surgery

## 2021-10-12 DIAGNOSIS — M6281 Muscle weakness (generalized): Secondary | ICD-10-CM | POA: Diagnosis present

## 2021-10-12 DIAGNOSIS — M4722 Other spondylosis with radiculopathy, cervical region: Secondary | ICD-10-CM | POA: Insufficient documentation

## 2021-10-12 DIAGNOSIS — M542 Cervicalgia: Secondary | ICD-10-CM | POA: Diagnosis present

## 2021-10-12 DIAGNOSIS — M4802 Spinal stenosis, cervical region: Secondary | ICD-10-CM | POA: Diagnosis present

## 2021-10-15 ENCOUNTER — Telehealth: Payer: Self-pay

## 2021-10-15 NOTE — Telephone Encounter (Signed)
TC due to missed visit on 10/15/21, line rang busy over several attempts

## 2021-10-22 ENCOUNTER — Ambulatory Visit: Payer: 59

## 2021-10-22 DIAGNOSIS — M6281 Muscle weakness (generalized): Secondary | ICD-10-CM

## 2021-10-22 DIAGNOSIS — M542 Cervicalgia: Secondary | ICD-10-CM | POA: Diagnosis not present

## 2021-10-22 DIAGNOSIS — M4802 Spinal stenosis, cervical region: Secondary | ICD-10-CM

## 2021-10-22 NOTE — Progress Notes (Unsigned)
Cardiology Clinic Note   Patient Name: Brandy Mccormick Date of Encounter: 10/23/2021  Primary Care Provider:  Moshe Cipro, NP Primary Cardiologist:  Peter Swaziland, MD  Patient Profile    Brandy Mccormick 56 year old female presents to the clinic today for follow-up evaluation of her hypertension and chest discomfort.  Past Medical History    Past Medical History:  Diagnosis Date   Arthritis    Depression    Hypertension    Surgical history of tubal ligation 2002   Past Surgical History:  Procedure Laterality Date   CARPAL TUNNEL RELEASE Left    COLONOSCOPY  2017   DILATATION & CURETTAGE/HYSTEROSCOPY WITH MYOSURE N/A 07/19/2015   Procedure: DILATATION & CURETTAGE/HYSTEROSCOPY WITH MYOSURE;  Surgeon: Myna Hidalgo, DO;  Location: [facility];  Service: Gynecology;  Laterality: N/A;  Polypectomy   KNEE ARTHROSCOPY Right 4/17   LAPAROSCOPY N/A 07/19/2015   Procedure: LAPAROSCOPY DIAGNOSTIC, EXPLORATORY LAPAROTOMY;  Surgeon: Myna Hidalgo, DO;  Location: [facility] OR;  Service: Gynecology;  Laterality: N/A;   WISDOM TOOTH EXTRACTION      Allergies  No Known Allergies  History of Present Illness    Brandy Mccormick has a PMH of cardiac murmur, hypertension, tobacco abuse and chest discomfort.  She was seen by Dr. Swaziland on 08/22/2021.  At that time she reported occasional episodes of chest discomfort that would radiate to her jaw.  She described the discomfort as a grabbing type sensation.  She also indicated that she had episodes of shortness of breath that were associated with her chest discomfort.  They would come on during periods of stress or increased exertion.  She had started blood pressure medication 1 month prior to the visit.  She was working as a [employer] manager.  She was smoking 1 pack/day.  She reported that her mother had coronary artery disease and peripheral chair disease.  Her EKG showed normal sinus rhythm 66 bpm.  She received counseling on smoking cessation.  A coronary CTA  was ordered.  This showed a coronary calcium score of 0.  She presents to the clinic today for follow-up evaluation and states she felt reassured that her test results were good.  She did have 1 episode of chest discomfort that was a throbbing type pain as she was driving located in her left chest.  It resolved on its own with rest.  We reviewed her results from her coronary CTA and she expressed understanding.  She presents with a wrist blood pressure cuff today that was reading 40 points higher than what our blood pressure cuff.  I recommended that she obtain a arm blood pressure cuff and she expressed understanding.  She is working on cutting back with regards to her smoking.  She has nicotine patches that she plans to start soon.  She is in the process of doing physical therapy for her back.  We will have her follow-up in 12 months, give the salty 6 diet sheet, have her increase her physical activity as tolerated, and give smoking cessation instructions.  Today she denies chest pain, shortness of breath, lower extremity edema, fatigue, palpitations, melena, hematuria, hemoptysis, diaphoresis, weakness, presyncope, syncope, orthopnea, and PND.    Home Medications    Prior to Admission medications   Medication Sig Start Date End Date Taking? Authorizing Provider  chlorthalidone (HYGROTON) 25 MG tablet Take 1 tablet (25 mg total) by mouth daily. 08/22/21 08/17/22  Swaziland, Peter M, MD  lisinopril (ZESTRIL) 20 MG tablet Take 1 tablet (20 mg  total) by mouth daily. 08/22/21 08/17/22  [place], Peter M, MD  meloxicam (MOBIC) 15 MG tablet Take 15 mg by mouth daily. 08/08/21   [provider]  methocarbamol (ROBAXIN) 500 MG tablet Take 1 tablet (500 mg total) by mouth every 8 (eight) hours as needed for muscle spasms. 09/21/21   Naida Sleight, PA-C  methylPREDNISolone (MEDROL) 4 MG tablet 6 day taper to be taken as directed. 09/21/21   Naida Sleight, PA-C  metoprolol tartrate (LOPRESSOR) 50 MG tablet Take  2 tablets (100 mg total) by mouth once for 1 dose. TAKE TWO HOURS PRIOR TO  SCHEDULE CARDIAC TEST 08/22/21 08/22/21  [place], Peter M, MD    Family History    Family History  Problem Relation Age of Onset   Heart attack Mother    Peripheral Artery Disease Mother    Arrhythmia Father    Cancer Other    She indicated that her mother is alive. She indicated that her father is alive. She indicated that the status of her other is unknown.  Social History    Social History   Socioeconomic History   Marital status: Divorced    Spouse name: Not on file   Number of children: 2   Years of education: Not on file   Highest education level: Not on file  Occupational History   Not on file  Tobacco Use   Smoking status: Every Day    Packs/day: 1.00    Types: Cigarettes   Smokeless tobacco: Never  Substance and Sexual Activity   Alcohol use: Yes    Comment: weekends   Drug use: No   Sexual activity: Yes    Birth control/protection: Post-menopausal  Other Topics Concern   Not on file  Social History Narrative   Works as [employer] manager.    Social Determinants of Health   Financial Resource Strain: Not on file  Food Insecurity: Not on file  Transportation Needs: Not on file  Physical Activity: Not on file  Stress: Not on file  Social Connections: Not on file  Intimate Partner Violence: Not on file     Review of Systems    General:  No chills, fever, night sweats or weight changes.  Cardiovascular:  No chest pain, dyspnea on exertion, edema, orthopnea, palpitations, paroxysmal nocturnal dyspnea. Dermatological: No rash, lesions/masses Respiratory: No cough, dyspnea Urologic: No hematuria, dysuria Abdominal:   No nausea, vomiting, diarrhea, bright red blood per rectum, melena, or hematemesis Neurologic:  No visual changes, wkns, changes in mental status. All other systems reviewed and are otherwise negative except as noted above.  Physical Exam    VS:  BP 116/78   Pulse 78   Ht  5\' 3"  (1.6 m)   Wt 162 lb 12.8 oz (73.8 kg)   SpO2 97%   BMI 28.84 kg/m  , BMI Body mass index is 28.84 kg/m. GEN: Well nourished, well developed, in no acute distress. HEENT: normal. Neck: Supple, no JVD, carotid bruits, or masses. Cardiac: RRR, no murmurs, rubs, or gallops. No clubbing, cyanosis, edema.  Radials/DP/PT 2+ and equal bilaterally.  Respiratory:  Respirations regular and unlabored, clear to auscultation bilaterally. GI: Soft, nontender, nondistended, BS + x 4. MS: no deformity or atrophy. Skin: warm and dry, no rash. Neuro:  Strength and sensation are intact. Psych: Normal affect.  Accessory Clinical Findings    Recent Labs: No results found for requested labs within last 365 days.   Recent Lipid Panel    Component Value Date/Time  CHOL 227 (H) 08/22/2021 0950   TRIG 86 08/22/2021 0950   HDL 99 08/22/2021 0950   CHOLHDL 2.3 08/22/2021 0950   LDLCALC 113 (H) 08/22/2021 0950    ECG personally reviewed by me today-none today.  Coronary CTA 09/14/2021  IMPRESSION: 1. Coronary calcium score of 0.   2. Normal coronary origin with right dominance.   3. Moderate stairstep artifact but the coronaries appear normal.   RECOMMENDATIONS: 1. No evidence of CAD (0%). Consider non-atherosclerotic causes of chest pain.   Lennie Odor, MD     Electronically Signed   By: Lennie Odor M.D.   On: 09/14/2021 13:07  Assessment & Plan   1.  Chest discomfort- throbbing discomfort occasionally over left chest.  Coronary CTA reviewed. Continue metoprolol Heart healthy low-sodium diet-salty 6 given Increase physical activity as tolerated .  Sure that her chest discomfort is not related to cardiac issues.  Essential hypertension-BP today 116/78.  Brings in a wrist cuff today that is not reading accurately.  Recommend on blood pressure cuff. Continue lisinopril, chlorthalidone, metoprolol Heart healthy low-sodium diet-salty 6 given Increase physical activity as  tolerated  Hyperlipidemia-LDL 113 on 08/22/2021. Heart healthy low-sodium high-fiber diet Increase physical activity as tolerated Stop smoking  Tobacco abuse-working on reducing smoking with hopes of quitting. Continue nicotine patches Smoking cessation strongly recommended Smoking cessation information given    Disposition: Follow-up with Dr. Swaziland in 12 months.   Thomasene Ripple. Ermalee Mealy NP-C     10/23/2021, 8:24 AM [facility] [address] Office [phone] Fax [phone]  Notice: This dictation was prepared with Dragon dictation along with smaller phrase technology. Any transcriptional errors that result from this process are unintentional and may not be corrected upon review.  I spent 12 minutes examining this patient, reviewing medications, and using patient centered shared decision making involving her cardiac care.  Prior to her visit I spent greater than 20 minutes reviewing her past medical history,  medications, and prior cardiac tests.

## 2021-10-22 NOTE — Therapy (Signed)
OUTPATIENT PHYSICAL THERAPY TREATMENT NOTE   Patient Name: Brandy Mccormick MRN: [id number] DOB:03/29/1965, 56 y.o., female Today's Date: 10/22/2021  PCP: Moshe Cipro, NP REFERRING PROVIDER: Naida Sleight, PA-C  END OF SESSION:   PT End of Session - 10/22/21 1002     Visit Number 2    Number of Visits 6    Date for PT Re-Evaluation 11/23/21    Authorization Type [employer]    PT Start Time 1001    PT Stop Time 1040    PT Time Calculation (min) 39 min    Activity Tolerance Patient tolerated treatment well;Patient limited by pain    Behavior During Therapy [facility] for tasks assessed/performed;Anxious             Past Medical History:  Diagnosis Date   Arthritis    Depression    Hypertension    Surgical history of tubal ligation 2002   Past Surgical History:  Procedure Laterality Date   CARPAL TUNNEL RELEASE Left    COLONOSCOPY  2017   DILATATION & CURETTAGE/HYSTEROSCOPY WITH MYOSURE N/A 07/19/2015   Procedure: DILATATION & CURETTAGE/HYSTEROSCOPY WITH MYOSURE;  Surgeon: Myna Hidalgo, DO;  Location: [facility];  Service: Gynecology;  Laterality: N/A;  Polypectomy   KNEE ARTHROSCOPY Right 4/17   LAPAROSCOPY N/A 07/19/2015   Procedure: LAPAROSCOPY DIAGNOSTIC, EXPLORATORY LAPAROTOMY;  Surgeon: Myna Hidalgo, DO;  Location: [facility] OR;  Service: Gynecology;  Laterality: N/A;   WISDOM TOOTH EXTRACTION     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  Patient states that currently neck symptoms are worse.  She  describes neck pain that radiates into both shoulders and down to the left shoulder blade.  She has right greater than left upper extremity numbness and tingling.  Low back pain is more central and across the low back.  No complaints of lower extremity radiculopathy.  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  States that this aggravates her neck and back symptoms.  Both issues ongoing for several years.  States that she has never really had this evaluated because she has not had any insurance.  Just recently got established with a primary care provider after not having one for at least 5 years.  She did have a cervical spine CT scan that was done August for 2021 when she presented to the [facility] long emergency department after a fall and that study showed:   PRECAUTIONS: None  SUBJECTIVE: Has been partially compliant with HEP, feels doorway stretch has been helpful for her low back pain.  PAIN:  Are you having pain? Yes: NPRS scale: 2/10 Pain location: neck/shoulders Pain description: ache Aggravating factors: driving Relieving factors: position changes   OBJECTIVE: (objective measures completed at initial evaluation unless otherwise dated)    DIAGNOSTIC FINDINGS:    EXAM: CT CERVICAL SPINE WITHOUT CONTRAST   TECHNIQUE: Multidetector CT imaging of the cervical spine was performed without intravenous contrast. Multiplanar CT image reconstructions were also generated.   COMPARISON:  None.   FINDINGS: Alignment: No static subluxation. Facets are aligned. Occipital  condyles and the lateral masses of C1 and C2 are normally approximated.   Skull base and vertebrae: No acute fracture.   Soft tissues and spinal canal: No prevertebral fluid or swelling. No visible canal hematoma.   Disc levels: There is severe left facet hypertrophy at C2-5. This causes stenosis of the left C3 and C4 neural foramina.   Upper chest: No pneumothorax, pulmonary nodule or pleural  effusion.   Other: Normal visualized paraspinal cervical soft tissues.   IMPRESSION: 1. No acute fracture or static subluxation of the cervical spine. 2. 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     Electronically Signed   By: Deatra Robinson M.D.   On: 10/14/2019 23:21   PATIENT SURVEYS:  NDI     COGNITION: Overall cognitive status: Within functional limits for tasks assessed     SENSATION: Not tested   POSTURE: rounded shoulders, forward head, and decreased cervical curvature   PALPATION: Global tenderness/tightness throughout     CERVICAL ROM:    Active ROM A/PROM (deg) eval  Flexion 90%  Extension 50%  Right lateral flexion 25%  Left lateral flexion 25%  Right rotation 90%  Left rotation 50%   (Blank rows = not tested)   UPPER EXTREMITY ROM: WNL   Active ROM Right eval Left eval  Shoulder flexion      Shoulder extension      Shoulder abduction      Shoulder adduction      Shoulder extension      Shoulder internal rotation      Shoulder external rotation      Elbow flexion      Elbow extension      Wrist flexion      Wrist extension      Wrist ulnar deviation      Wrist radial deviation      Wrist pronation      Wrist supination          UPPER EXTREMITY MMT:   MMT Right eval Left eval  Shoulder flexion 5 5  Shoulder extension 5 5  Shoulder abduction 5 5  Shoulder adduction      Shoulder extension      Shoulder internal rotation 5 5  Shoulder external rotation 5 5  Middle trapezius      Lower trapezius      Elbow flexion 5 5  Elbow extension 5 5  Wrist flexion 5 5  Wrist extension 5 5  Wrist ulnar deviation      Wrist radial deviation      Wrist pronation      Wrist supination      Grip strength 66# 72#   (Blank rows = not tested)   CERVICAL SPECIAL TESTS:  Cranial cervical flexion test: Negative     FUNCTIONAL TESTS:  N/a     TODAY'S TREATMENT:  [facility] Adult PT  Treatment:                                                DATE: 10/22/21 Therapeutic Exercise: UBE L1 5 min B UT stretch 30s x2 B levator stretch 30s x2 Supine B flexion 15x focus on breathing patterns Supine hor abd YTB 15x focus on breathing Supine alt. UE flexion 15/15 Chin tucks over 1/2 roll 3x10 Curl ups 15x  Open book 10/10      PATIENT EDUCATION:  Education details: Discussed eval findings, rehab rationale and POC and patient is in agreement  Person educated: Patient Education method: Explanation Education comprehension: verbalized understanding and needs further education     HOME EXERCISE PROGRAM: Access Code: [email or web address] URL: [email or web address] Date: 10/22/2021 Prepared by: Gustavus Bryant  Exercises - Doorway Pec Stretch at 90 Degrees Abduction  - 2 x daily - 7 x weekly - 1 sets - 3 reps - 30s hold - Supine Cervical Retraction with Towel  - 2 x daily - 7 x weekly - 3 sets - 10 reps - 3s hold - Sidelying Open Book Thoracic Lumbar Rotation and Extension  - 2 x daily - 7 x weekly - 1 sets - 10 reps ASSESSMENT:   CLINICAL IMPRESSION: Today's session focused on HEP review, cervical and thoracic stretching, postural training and increasing spinal mobility on cervical region.  Continued    OBJECTIVE IMPAIRMENTS decreased activity tolerance, decreased knowledge of condition, decreased mobility, decreased ROM, hypomobility, increased fascial restrictions, impaired flexibility, impaired UE functional use, and pain.    ACTIVITY LIMITATIONS carrying, lifting, sitting, and standing   PARTICIPATION LIMITATIONS: driving and occupation   PERSONAL FACTORS Fitness, Profession, and Time since onset of injury/illness/exacerbation are also affecting patient's functional outcome.    REHAB POTENTIAL: Good   CLINICAL DECISION MAKING: Stable/uncomplicated   EVALUATION COMPLEXITY: Moderate     GOALS: Goals reviewed with patient? No   SHORT TERM GOALS: Target date:  11/02/2021    Patient to demonstrate independence in HEP  Baseline: [id number] Goal status: INITIAL   2.  Decrease pain to 6/10 when driving Baseline: 5/91 when driving Goal status: INITIAL       LONG TERM GOALS: Target date: 11/23/2021   Increase cervical SB and L rotation to 75% Baseline:  Active ROM A/PROM (deg) eval  Flexion 90%  Extension 50%  Right lateral flexion 25%  Left lateral flexion 25%  Right rotation 90%  Left rotation 50%    Goal status: INITIAL   2.  Decrease ODI to 10/50 Baseline: 16/50 Goal status: INITIAL   3.  Decrease average pain to 4/10 Baseline: 6/10 average pain Goal status: INITIAL   4.  Patient able to demo neutral posture when cued Baseline: fwd head and rounded shoulder posture Goal status: INITIAL         PLAN: PT FREQUENCY: 1-2x/week   PT DURATION: 6 weeks   PLANNED INTERVENTIONS: Therapeutic exercises, Therapeutic activity, Neuromuscular re-education, Balance training, Gait training, Patient/Family education, Self Care, Joint mobilization, Joint manipulation, Dry Needling, Manual therapy, and Re-evaluation   PLAN FOR NEXT SESSION: HEP f/u, postural training, flexibility and mobility tasks     Hildred Laser, PT 10/22/2021, 10:44 AM

## 2021-10-23 ENCOUNTER — Encounter: Payer: Self-pay | Admitting: General Practice

## 2021-10-23 ENCOUNTER — Ambulatory Visit: Payer: 59 | Admitting: General Practice

## 2021-10-23 VITALS — BP 116/78 | HR 78 | Ht 63.0 in | Wt 162.8 lb

## 2021-10-23 DIAGNOSIS — E782 Mixed hyperlipidemia: Secondary | ICD-10-CM

## 2021-10-23 DIAGNOSIS — I10 Essential (primary) hypertension: Secondary | ICD-10-CM

## 2021-10-23 DIAGNOSIS — R079 Chest pain, unspecified: Secondary | ICD-10-CM | POA: Diagnosis not present

## 2021-10-23 DIAGNOSIS — Z720 Tobacco use: Secondary | ICD-10-CM | POA: Diagnosis not present

## 2021-10-23 NOTE — Patient Instructions (Signed)
Medication Instructions:  The current medical regimen is effective;  continue present plan and medications as directed. Please refer to the Current Medication list given to you today.   *If you need a refill on your cardiac medications before your next appointment, please call your pharmacy*  Lab Work:   Testing/Procedures:  none    none  Special Instructions PLEASE READ AND FOLLOW SALTY 6-ATTACHED-1,800mg  daily  PLEASE INCREASE PHYSICAL ACTIVITY AS TOLERATED   PLEASE READ AND FOLLOW SMOKING CESSATION TIPS-ATTACHED  PLEASE PURCHASE A UPPER ARM BLOOD PRESSURE CUFF   Follow-Up: Your next appointment:  12 month(s) In Person with Peter [place], MD     Please call our office 2 months in advance to schedule this appointment  :1  At [facility], you and your health needs are our priority.  As part of our continuing mission to provide you with exceptional heart care, we have created designated Provider Care Teams.  These Care Teams include your primary Cardiologist (physician) and Advanced Practice Providers (APPs -  Physician Assistants and Nurse Practitioners) who all work together to provide you with the care you need, when you need it.  We recommend signing up for the patient portal called "MyChart".  Sign up information is provided on this After Visit Summary.  MyChart is used to connect with patients for Virtual Visits (Telemedicine).  Patients are able to view lab/test results, encounter notes, upcoming appointments, etc.  Non-urgent messages can be sent to your provider as well.   To learn more about what you can do with MyChart, go to [email or web address].    Important Information About Sugar             6 SALTY THINGS TO AVOID     1,800MG  DAILY      Steps to Quit Smoking Smoking tobacco is the leading cause of preventable death. It can affect almost every organ in the body. Smoking puts you and people around you at risk for many serious, long-lasting (chronic) diseases.  Quitting smoking can be hard, but it is one of the best things that you can do for your health. It is never too late to quit. Do not give up if you cannot quit the first time. Some people need to try many times to quit. Do your best to stick to your quit plan, and talk with your doctor if you have any questions or concerns. How do I get ready to quit? Pick a date to quit. Set a date within the next 2 weeks to give you time to prepare. Write down the reasons why you are quitting. Keep this list in places where you will see it often. Tell your family, friends, and co-workers that you are quitting. Their support is important. Talk with your doctor about the choices that may help you quit. Find out if your health insurance will pay for these treatments. Know the people, places, things, and activities that make you want to smoke (triggers). Avoid them. What first steps can I take to quit smoking? Throw away all cigarettes at home, at work, and in your car. Throw away the things that you use when you smoke, such as ashtrays and lighters. Clean your car. Empty the ashtray. Clean your home, including curtains and carpets. What can I do to help me quit smoking? Talk with your doctor about taking medicines and seeing a counselor. You are more likely to succeed when you do both. If you are pregnant or breastfeeding: Talk with your doctor about counseling  or other ways to quit smoking. Do not take medicine to help you quit smoking unless your doctor tells you to. Quit right away Quit smoking completely, instead of slowly cutting back on how much you smoke over a period of time. Stopping smoking right away may be more successful than slowly quitting. Go to counseling. In-person is best if this is an option. You are more likely to quit if you go to counseling sessions regularly. Take medicine You may take medicines to help you quit. Some medicines need a prescription, and some you can buy over-the-counter.  Some medicines may contain a drug called nicotine to replace the nicotine in cigarettes. Medicines may: Help you stop having the desire to smoke (cravings). Help to stop the problems that come when you stop smoking (withdrawal symptoms). Your doctor may ask you to use: Nicotine patches, gum, or lozenges. Nicotine inhalers or sprays. Non-nicotine medicine that you take by mouth. Find resources Find resources and other ways to help you quit smoking and remain smoke-free after you quit. They include: Online chats with a Veterinary surgeon. Phone quitlines. Printed Materials engineer. Support groups or group counseling. Text messaging programs. Mobile phone apps. Use apps on your mobile phone or tablet that can help you stick to your quit plan. Examples of free services include Quit Guide from the CDC and smokefree.gov  What can I do to make it easier to quit?  Talk to your family and friends. Ask them to support and encourage you. Call a phone quitline, such as 1-800-QUIT-NOW, reach out to support groups, or work with a Veterinary surgeon. Ask people who smoke to not smoke around you. Avoid places that make you want to smoke, such as: Bars. Parties. Smoke-break areas at work. Spend time with people who do not smoke. Lower the stress in your life. Stress can make you want to smoke. Try these things to lower stress: Getting regular exercise. Doing deep-breathing exercises. Doing yoga. Meditating. What benefits will I see if I quit smoking? Over time, you may have: A better sense of smell and taste. Less coughing and sore throat. A slower heart rate. Lower blood pressure. Clearer skin. Better breathing. Fewer sick days. Summary Quitting smoking can be hard, but it is one of the best things that you can do for your health. Do not give up if you cannot quit the first time. Some people need to try many times to quit. When you decide to quit smoking, make a plan to help you succeed. Quit smoking  right away, not slowly over a period of time. When you start quitting, get help and support to keep you smoke-free. This information is not intended to replace advice given to you by your health care provider. Make sure you discuss any questions you have with your health care provider. Document Revised: 02/16/2021 Document Reviewed: 02/16/2021 Elsevier Patient Education  2023 [employer].

## 2021-10-29 ENCOUNTER — Ambulatory Visit: Payer: 59

## 2021-10-29 DIAGNOSIS — M4802 Spinal stenosis, cervical region: Secondary | ICD-10-CM

## 2021-10-29 DIAGNOSIS — M542 Cervicalgia: Secondary | ICD-10-CM

## 2021-10-29 DIAGNOSIS — M6281 Muscle weakness (generalized): Secondary | ICD-10-CM

## 2021-10-29 NOTE — Therapy (Signed)
OUTPATIENT PHYSICAL THERAPY TREATMENT NOTE   Patient Name: Brandy Mccormick MRN: [id number] DOB:06/19/1965, 55 y.o., female Today's Date: 10/29/2021  PCP: Faustino Congress, NP REFERRING PROVIDER: Lanae Crumbly, PA-C  END OF SESSION:   PT End of Session - 10/29/21 0915     Visit Number 3    Number of Visits 6    Date for PT Re-Evaluation 11/23/21    Authorization Type [employer]    PT Start Time 0915    PT Stop Time 0955    PT Time Calculation (min) 40 min    Activity Tolerance Patient tolerated treatment well;Patient limited by pain    Behavior During Therapy [facility] for tasks assessed/performed;Anxious             Past Medical History:  Diagnosis Date   Arthritis    Depression    Hypertension    Surgical history of tubal ligation 2002   Past Surgical History:  Procedure Laterality Date   CARPAL TUNNEL RELEASE Left    COLONOSCOPY  2017   DILATATION & CURETTAGE/HYSTEROSCOPY WITH MYOSURE N/A 07/19/2015   Procedure: DILATATION & CURETTAGE/HYSTEROSCOPY WITH MYOSURE;  Surgeon: Janyth Pupa, DO;  Location: [facility];  Service: Gynecology;  Laterality: N/A;  Polypectomy   KNEE ARTHROSCOPY Right 4/17   LAPAROSCOPY N/A 07/19/2015   Procedure: LAPAROSCOPY DIAGNOSTIC, EXPLORATORY LAPAROTOMY;  Surgeon: Janyth Pupa, DO;  Location: [facility];  Service: Gynecology;  Laterality: N/A;   WISDOM TOOTH EXTRACTION     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  Patient states that currently neck symptoms are worse.  She  describes neck pain that radiates into both shoulders and down to the left shoulder blade.  She has right greater than left upper extremity numbness and tingling.  Low back pain is more central and across the low back.  No complaints of lower extremity radiculopathy.  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  States that this aggravates her neck and back symptoms.  Both issues ongoing for several years.  States that she has never really had this evaluated because she has not had any insurance.  Just recently got established with a primary care provider after not having one for at least 5 years.  She did have a cervical spine CT scan that was done August for 2021 when she presented to the [facility] long emergency department after a fall and that study showed:   PRECAUTIONS: None  SUBJECTIVE: Reports stretching helpful and has been compliant with exercises and is doing them while driving.  PAIN:  Are you having pain? Yes: NPRS scale: 2/10 Pain location: neck/shoulders Pain description: ache Aggravating factors: driving Relieving factors: position changes   OBJECTIVE: (objective measures completed at initial evaluation unless otherwise dated)    DIAGNOSTIC FINDINGS:    EXAM: CT CERVICAL SPINE WITHOUT CONTRAST   TECHNIQUE: Multidetector CT imaging of the cervical spine was performed without intravenous contrast. Multiplanar CT image reconstructions were also generated.   COMPARISON:  None.   FINDINGS: Alignment: No static subluxation. Facets are aligned. Occipital condyles and  the lateral masses of C1 and C2 are normally approximated.   Skull base and vertebrae: No acute fracture.   Soft tissues and spinal canal: No prevertebral fluid or swelling. No visible canal hematoma.   Disc levels: There is severe left facet hypertrophy at C2-5. This causes stenosis of the left C3 and C4 neural foramina.   Upper chest: No pneumothorax, pulmonary nodule or pleural  effusion.   Other: Normal visualized paraspinal cervical soft tissues.   IMPRESSION: 1. No acute fracture or static subluxation of the cervical spine. 2. 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     Electronically Signed   By: Ulyses Jarred M.D.   On: 10/14/2019 23:21   PATIENT SURVEYS:  NDI     COGNITION: Overall cognitive status: Within functional limits for tasks assessed     SENSATION: Not tested   POSTURE: rounded shoulders, forward head, and decreased cervical curvature   PALPATION: Global tenderness/tightness throughout     CERVICAL ROM:    Active ROM A/PROM (deg) eval  Flexion 90%  Extension 50%  Right lateral flexion 25%  Left lateral flexion 25%  Right rotation 90%  Left rotation 50%   (Blank rows = not tested)   UPPER EXTREMITY ROM: WNL   Active ROM Right eval Left eval  Shoulder flexion      Shoulder extension      Shoulder abduction      Shoulder adduction      Shoulder extension      Shoulder internal rotation      Shoulder external rotation      Elbow flexion      Elbow extension      Wrist flexion      Wrist extension      Wrist ulnar deviation      Wrist radial deviation      Wrist pronation      Wrist supination          UPPER EXTREMITY MMT:   MMT Right eval Left eval  Shoulder flexion 5 5  Shoulder extension 5 5  Shoulder abduction 5 5  Shoulder adduction      Shoulder extension      Shoulder internal rotation 5 5  Shoulder external rotation 5 5  Middle trapezius      Lower trapezius      Elbow flexion 5 5  Elbow extension 5 5  Wrist flexion 5 5  Wrist extension 5 5  Wrist ulnar deviation      Wrist radial deviation      Wrist pronation      Wrist supination      Grip strength 66# 72#   (Blank rows = not tested)   CERVICAL SPECIAL TESTS:  Cranial cervical flexion test: Negative     FUNCTIONAL TESTS:  N/a     TODAY'S TREATMENT:   [facility] Adult PT  Treatment:                                                DATE: 10/29/21 Therapeutic Exercise: UBE L 1.5 6 min (3/3) B UT stretch 30s x2 B levator stretch 30s x2 Supine B flexion 15x focus on breathing patterns Supine hor abd RTB 15x focus on breathing Supine alt. UE flexion 15/15 1# Chin tucks over 1/2 roll 3x10 Curl  ups 15x PPT 3s 15x Supine march w/PPT 15/15 Open book 10/10 Quadruped bird dog 10/10   [facility] Adult PT Treatment:                                                DATE: 10/22/21 Therapeutic Exercise: UBE L1 5 min B UT stretch 30s x2 B levator stretch 30s x2 Supine B flexion 15x focus on breathing patterns Supine hor abd YTB 15x focus on breathing Supine alt. UE flexion 15/15 Chin tucks over 1/2 roll 3x10 Curl ups 15x Open book 10/10      PATIENT EDUCATION:  Education details: Discussed eval findings, rehab rationale and POC and patient is in agreement  Person educated: Patient Education method: Explanation Education comprehension: verbalized understanding and needs further education     HOME EXERCISE PROGRAM: Access Code: [email or web address] URL: [email or web address] Date: 10/22/2021 Prepared by: Sharlynn Oliphant  Exercises - Doorway Pec Stretch at 90 Degrees Abduction  - 2 x daily - 7 x weekly - 1 sets - 3 reps - 30s hold - Supine Cervical Retraction with Towel  - 2 x daily - 7 x weekly - 3 sets - 10 reps - 3s hold - Sidelying Open Book Thoracic Lumbar Rotation and Extension  - 2 x daily - 7 x weekly - 1 sets - 10 reps ASSESSMENT:   CLINICAL IMPRESSION: Good compliance with HEP, has been able to perform stretches throughout the day.  Discussed refraining from prolonged positioning such as during cooking and dishwashing. Overall pain levels have decreased, and function has increased.  Added additional thoracic mobility tasks to address pain concerns.   OBJECTIVE IMPAIRMENTS decreased activity tolerance, decreased knowledge of condition, decreased  mobility, decreased ROM, hypomobility, increased fascial restrictions, impaired flexibility, impaired UE functional use, and pain.    ACTIVITY LIMITATIONS carrying, lifting, sitting, and standing   PARTICIPATION LIMITATIONS: driving and occupation   PERSONAL FACTORS Fitness, Profession, and Time since onset of injury/illness/exacerbation are also affecting patient's functional outcome.    REHAB POTENTIAL: Good   CLINICAL DECISION MAKING: Stable/uncomplicated   EVALUATION COMPLEXITY: Moderate     GOALS: Goals reviewed with patient? No   SHORT TERM GOALS: Target date: 11/02/2021    Patient to demonstrate independence in HEP  Baseline: [id number] Goal status: Met   2.  Decrease pain to 6/10 when driving Baseline: 6/38 when driving; 9/37/34 2-8/76 when driving Goal status: partially met       LONG TERM GOALS: Target date: 11/23/2021   Increase cervical SB and L rotation to 75% Baseline:  Active ROM A/PROM (deg) eval  Flexion 90%  Extension 50%  Right lateral flexion 25%  Left lateral flexion 25%  Right rotation 90%  Left rotation 50%    Goal status: INITIAL   2.  Decrease ODI to 10/50 Baseline: 16/50 Goal status: INITIAL   3.  Decrease average pain to 4/10 Baseline: 6/10 average pain Goal status: INITIAL   4.  Patient able to demo neutral posture when cued Baseline: fwd head and rounded shoulder posture Goal status: INITIAL         PLAN: PT FREQUENCY: 1-2x/week   PT DURATION: 6 weeks   PLANNED INTERVENTIONS: Therapeutic exercises, Therapeutic activity, Neuromuscular re-education, Balance training, Gait training, Patient/Family education, Self Care, Joint mobilization, Joint manipulation, Dry Needling, Manual therapy, and Re-evaluation   PLAN FOR NEXT SESSION: HEP f/u, postural  training, flexibility and mobility tasks     Lanice Shirts, PT 10/29/2021, 9:57 AM

## 2021-11-09 ENCOUNTER — Ambulatory Visit: Payer: 59 | Attending: Surgery

## 2021-11-09 DIAGNOSIS — M4802 Spinal stenosis, cervical region: Secondary | ICD-10-CM | POA: Insufficient documentation

## 2021-11-09 DIAGNOSIS — M542 Cervicalgia: Secondary | ICD-10-CM | POA: Insufficient documentation

## 2021-11-09 DIAGNOSIS — M6281 Muscle weakness (generalized): Secondary | ICD-10-CM | POA: Insufficient documentation

## 2021-11-09 NOTE — Therapy (Signed)
OUTPATIENT PHYSICAL THERAPY TREATMENT NOTE   Patient Name: Brandy Mccormick MRN: [id number] DOB:[birth year], 56 y.o., female Today's Date: 11/09/2021  PCP: Faustino Congress, NP REFERRING PROVIDER: Lanae Crumbly, PA-C  END OF SESSION:   PT End of Session - 11/09/21 0929     Visit Number 4    Number of Visits 6    Date for PT Re-Evaluation 11/23/21    Authorization Type [employer]    PT Start Time [phone]   late for aaappointment   PT Stop Time 0955    PT Time Calculation (min) 30 min    Activity Tolerance Patient tolerated treatment well;Patient limited by pain    Behavior During Therapy [facility] for tasks assessed/performed;Anxious             Past Medical History:  Diagnosis Date   Arthritis    Depression    Hypertension    Surgical history of tubal ligation 2002   Past Surgical History:  Procedure Laterality Date   CARPAL TUNNEL RELEASE Left    COLONOSCOPY  2017   DILATATION & CURETTAGE/HYSTEROSCOPY WITH MYOSURE N/A 07/19/2015   Procedure: DILATATION & CURETTAGE/HYSTEROSCOPY WITH MYOSURE;  Surgeon: Janyth Pupa, DO;  Location: [facility];  Service: Gynecology;  Laterality: N/A;  Polypectomy   KNEE ARTHROSCOPY Right 4/17   LAPAROSCOPY N/A 07/19/2015   Procedure: LAPAROSCOPY DIAGNOSTIC, EXPLORATORY LAPAROTOMY;  Surgeon: Janyth Pupa, DO;  Location: [place];  Service: Gynecology;  Laterality: N/A;   WISDOM TOOTH EXTRACTION     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    REFERRING DIAG: M54.2 (ICD-10-CM) - Neck pain M47.22 (ICD-10-CM) - Other spondylosis with radiculopathy, cervical region   THERAPY DIAG:  Cervicalgia  Muscle weakness (generalized)  Spinal stenosis in cervical region  Rationale for Evaluation and Treatment Rehabilitation  PERTINENT HISTORY: 55 year old white female who is new patient to clinic comes in today with complaints of chronic neck pain and low back pain.  Patient states that currently neck  symptoms are worse.  She describes neck pain that radiates into both shoulders and down to the left shoulder blade.  She has right greater than left upper extremity numbness and tingling.  Low back pain is more central and across the low back.  No complaints of lower extremity radiculopathy.  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  States that this aggravates her neck and back symptoms.  Both issues ongoing for several years.  States that she has never really had this evaluated because she has not had any insurance.  Just recently got established with a primary care provider after not having one for at least 5 years.  She did have a cervical spine CT scan that was done August for 2021 when she presented to the [facility] long emergency department after a fall and that study showed:   PRECAUTIONS: None  SUBJECTIVE: Symptoms less intense, less discomfort driving, still noted BUE paresthesias at day end/night  PAIN:  Are you having pain? Yes: NPRS scale: 2/10 Pain location: neck/shoulders Pain description: ache Aggravating factors: driving Relieving factors: position changes   OBJECTIVE: (objective measures completed at initial evaluation unless otherwise dated)    DIAGNOSTIC FINDINGS:    EXAM: CT CERVICAL SPINE WITHOUT CONTRAST   TECHNIQUE: Multidetector CT imaging of the cervical spine was performed without intravenous contrast. Multiplanar CT image reconstructions were also generated.   COMPARISON:  None.   FINDINGS: Alignment: No static subluxation. Facets are aligned. Occipital  condyles and the lateral masses of C1 and C2 are normally approximated.   Skull base and vertebrae: No acute fracture.   Soft tissues and spinal canal: No prevertebral fluid or swelling. No visible canal hematoma.   Disc levels: There is severe left facet hypertrophy at C2-5. This causes stenosis of the left C3 and C4 neural foramina.   Upper chest: No pneumothorax, pulmonary nodule  or pleural effusion.   Other: Normal visualized paraspinal cervical soft tissues.   IMPRESSION: 1. No acute fracture or static subluxation of the cervical spine. 2. 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     Electronically Signed   By: Ulyses Jarred M.D.   On: 10/14/2019 23:21   PATIENT SURVEYS:  NDI     COGNITION: Overall cognitive status: Within functional limits for tasks assessed     SENSATION: Not tested   POSTURE: rounded shoulders, forward head, and decreased cervical curvature   PALPATION: Global tenderness/tightness throughout     CERVICAL ROM:    Active ROM A/PROM (deg) eval  Flexion 90%  Extension 50%  Right lateral flexion 25%  Left lateral flexion 25%  Right rotation 90%  Left rotation 50%   (Blank rows = not tested)   UPPER EXTREMITY ROM: WNL   Active ROM Right eval Left eval  Shoulder flexion      Shoulder extension      Shoulder abduction      Shoulder adduction      Shoulder extension      Shoulder internal rotation      Shoulder external rotation      Elbow flexion      Elbow extension      Wrist flexion      Wrist extension      Wrist ulnar deviation      Wrist radial deviation      Wrist pronation      Wrist supination          UPPER EXTREMITY MMT:   MMT Right eval Left eval  Shoulder flexion 5 5  Shoulder extension 5 5  Shoulder abduction 5 5  Shoulder adduction      Shoulder extension      Shoulder internal rotation 5 5  Shoulder external rotation 5 5  Middle trapezius      Lower trapezius      Elbow flexion 5 5  Elbow extension 5 5  Wrist flexion 5 5  Wrist extension 5 5  Wrist ulnar deviation      Wrist radial deviation      Wrist pronation      Wrist supination      Grip strength 66# 72#   (Blank rows = not tested)   CERVICAL SPECIAL TESTS:  Cranial cervical flexion test: Negative     FUNCTIONAL TESTS:  N/a     TODAY'S TREATMENT:  [facility] Adult  PT Treatment:                                                DATE: 11/09/21 Therapeutic Exercise: UBE L2 3/3 min Supine flexion w/cane 15x emphasizing breathing patterns Supine alt UE flexion 15/15 w/PPT Supine hor abd YTB 15x emphasizing breathing patterns Chin tuck 1/2 roll 2x10 PPT 3s 15x Supine march w/PPT 15/15 Open book 10/10 with breathing patterns  [facility] Adult PT Treatment:                                                DATE: 10/29/21 Therapeutic Exercise: UBE L 1.5 6 min (3/3) B UT stretch 30s x2 B levator stretch 30s x2 Supine B flexion 15x focus on breathing patterns Supine hor abd RTB 15x focus on breathing Supine alt. UE flexion 15/15 1# Chin tucks over 1/2 roll 3x10 Curl ups 15x PPT 3s 15x Supine march w/PPT 15/15 Open book 10/10 Quadruped bird dog 10/10   [facility] Adult PT Treatment:                                                DATE: 10/22/21 Therapeutic Exercise: UBE L1 5 min B UT stretch 30s x2 B levator stretch 30s x2 Supine B flexion 15x focus on breathing patterns Supine hor abd YTB 15x focus on breathing Supine alt. UE flexion 15/15 Chin tucks over 1/2 roll 3x10 Curl ups 15x Open book 10/10      PATIENT EDUCATION:  Education details: Discussed eval findings, rehab rationale and POC and patient is in agreement  Person educated: Patient Education method: Explanation Education comprehension: verbalized understanding and needs further education     HOME EXERCISE PROGRAM: Access Code: [email or web address] URL: [email or web address] Date: 10/22/2021 Prepared by: Sharlynn Oliphant  Exercises - Doorway Pec Stretch at 90 Degrees Abduction  - 2 x daily - 7 x weekly - 1 sets - 3 reps - 30s hold - Supine Cervical Retraction with Towel  - 2 x daily - 7 x weekly - 3 sets - 10 reps - 3s hold - Sidelying Open Book Thoracic Lumbar Rotation and Extension  - 2 x daily - 7 x weekly - 1 sets - 10 reps ASSESSMENT:   CLINICAL IMPRESSION: Symptoms lessening, STGs  met, overall pain rating has decreased, has made activity modifications which have helped manage her symptoms   OBJECTIVE IMPAIRMENTS decreased activity tolerance, decreased knowledge of condition, decreased mobility, decreased ROM, hypomobility, increased fascial restrictions, impaired flexibility, impaired UE functional use, and pain.    ACTIVITY LIMITATIONS carrying, lifting, sitting, and standing   PARTICIPATION LIMITATIONS: driving and occupation   PERSONAL FACTORS Fitness, Profession, and Time since onset of injury/illness/exacerbation are also affecting patient's functional outcome.    REHAB POTENTIAL: Good   CLINICAL DECISION MAKING: Stable/uncomplicated   EVALUATION COMPLEXITY: Moderate     GOALS: Goals reviewed with patient? No   SHORT TERM GOALS: Target date: 11/02/2021    Patient to demonstrate independence in HEP  Baseline: [id number] Goal status: Met   2.  Decrease pain to 6/10 when driving Baseline: 9/41 when driving; 7/40/81 4-4/81 when driving; 8/56/31 4/97  Goal status: met       LONG TERM GOALS: Target date: 11/23/2021   Increase cervical SB and L rotation to 75% Baseline:  Active ROM A/PROM (deg) eval  Flexion 90%  Extension 50%  Right lateral flexion 25%  Left lateral flexion 25%  Right rotation 90%  Left rotation 50%    Goal status: INITIAL   2.  Decrease ODI to 10/50 Baseline: 16/50 Goal status: INITIAL   3.  Decrease average pain to 4/10 Baseline: 6/10 average pain Goal status: INITIAL  4.  Patient able to demo neutral posture when cued Baseline: fwd head and rounded shoulder posture Goal status: INITIAL         PLAN: PT FREQUENCY: 1-2x/week   PT DURATION: 6 weeks   PLANNED INTERVENTIONS: Therapeutic exercises, Therapeutic activity, Neuromuscular re-education, Balance training, Gait training, Patient/Family education, Self Care, Joint mobilization, Joint manipulation, Dry Needling, Manual therapy, and Re-evaluation   PLAN FOR  NEXT SESSION: HEP f/u, postural training, flexibility and mobility tasks, assess goal progress and CROM     Lanice Shirts, PT 11/09/2021, 9:56 AM

## 2021-11-13 ENCOUNTER — Ambulatory Visit: Payer: 59

## 2021-11-13 DIAGNOSIS — M6281 Muscle weakness (generalized): Secondary | ICD-10-CM | POA: Diagnosis not present

## 2021-11-13 DIAGNOSIS — M4802 Spinal stenosis, cervical region: Secondary | ICD-10-CM

## 2021-11-13 DIAGNOSIS — M542 Cervicalgia: Secondary | ICD-10-CM

## 2021-11-13 NOTE — Therapy (Addendum)
OUTPATIENT PHYSICAL THERAPY TREATMENT NOTE/DC SUMMARY   Patient Name: Brandy Mccormick MRN: [id number] DOB:06/05/1965, 56 y.o., female Today's Date: 11/13/2021  PCP: Faustino Congress, NP REFERRING PROVIDER: Lanae Crumbly, PA-C PHYSICAL THERAPY DISCHARGE SUMMARY  Visits from Start of Care: 5  Current functional level related to goals / functional outcomes: Goals partially met   Remaining deficits: Low level pain   Education / Equipment: HEP   Patient agrees to discharge. Patient goals were partially met. Patient is being discharged due to not returning since the last visit.  END OF SESSION:   PT End of Session - 11/13/21 1047     Visit Number 5    Number of Visits 6    Date for PT Re-Evaluation 11/23/21    Authorization Type [employer]    PT Start Time 8127    PT Stop Time 1125    PT Time Calculation (min) 38 min    Activity Tolerance Patient tolerated treatment well;Patient limited by pain    Behavior During Therapy [facility] for tasks assessed/performed;Anxious             Past Medical History:  Diagnosis Date   Arthritis    Depression    Hypertension    Surgical history of tubal ligation 2002   Past Surgical History:  Procedure Laterality Date   CARPAL TUNNEL RELEASE Left    COLONOSCOPY  2017   DILATATION & CURETTAGE/HYSTEROSCOPY WITH MYOSURE N/A 07/19/2015   Procedure: DILATATION & CURETTAGE/HYSTEROSCOPY WITH MYOSURE;  Surgeon: Janyth Pupa, DO;  Location: [facility];  Service: Gynecology;  Laterality: N/A;  Polypectomy   KNEE ARTHROSCOPY Right 4/17   LAPAROSCOPY N/A 07/19/2015   Procedure: LAPAROSCOPY DIAGNOSTIC, EXPLORATORY LAPAROTOMY;  Surgeon: Janyth Pupa, DO;  Location: [facility];  Service: Gynecology;  Laterality: N/A;   WISDOM TOOTH EXTRACTION     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  Patient states that currently neck symptoms are worse.  She describes neck pain that radiates into both shoulders and down to the left shoulder blade.  She has right greater than left upper extremity numbness and tingling.  Low back pain is more central and across the low back.  No complaints of lower extremity radiculopathy.  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  States that this aggravates her neck and back symptoms.  Both issues ongoing for several years.  States that she has never really had this evaluated because she has not had any insurance.  Just recently got established with a primary care provider after not having one for at least 5 years.  She did have a cervical spine CT scan that was done August for 2021 when she presented to the [facility] long emergency department after a fall and that study showed:   PRECAUTIONS: None  SUBJECTIVE: Symptoms improved overall, no drastic changes since last session.  PAIN:  Are you having pain? Yes: NPRS scale: 2/10 Pain location: neck/shoulders Pain description: ache Aggravating factors: driving Relieving factors: position changes   OBJECTIVE: (objective measures completed at initial evaluation unless otherwise  dated)    DIAGNOSTIC FINDINGS:    EXAM: CT CERVICAL SPINE WITHOUT CONTRAST   TECHNIQUE: Multidetector CT imaging of the cervical spine was performed without intravenous contrast. Multiplanar CT image reconstructions were also generated.   COMPARISON:  None.   FINDINGS: Alignment: No static subluxation. Facets are aligned. Occipital condyles and the lateral masses of C1 and C2 are normally approximated.    Skull base and vertebrae: No acute fracture.   Soft tissues and spinal canal: No prevertebral fluid or swelling. No visible canal hematoma.   Disc levels: There is severe left facet hypertrophy at C2-5. This causes stenosis of the left C3 and C4 neural foramina.   Upper chest: No pneumothorax, pulmonary nodule or pleural effusion.   Other: Normal visualized paraspinal cervical soft tissues.   IMPRESSION: 1. No acute fracture or static subluxation of the cervical spine. 2. 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     Electronically Signed   By: Ulyses Jarred M.D.   On: 10/14/2019 23:21   PATIENT SURVEYS:  NDI     COGNITION: Overall cognitive status: Within functional limits for tasks assessed     SENSATION: Not tested   POSTURE: rounded shoulders, forward head, and decreased cervical curvature   PALPATION: Global tenderness/tightness throughout     CERVICAL ROM:    Active ROM A/PROM (deg) eval  Flexion 90%  Extension 50%  Right lateral flexion 25%  Left lateral flexion 25%  Right rotation 90%  Left rotation 50%   (Blank rows = not tested)   UPPER EXTREMITY ROM: WNL   Active ROM Right eval Left eval  Shoulder flexion      Shoulder extension      Shoulder abduction      Shoulder adduction      Shoulder extension      Shoulder internal rotation      Shoulder external rotation      Elbow flexion      Elbow extension      Wrist flexion      Wrist extension      Wrist ulnar deviation      Wrist radial deviation      Wrist pronation      Wrist supination          UPPER EXTREMITY MMT:   MMT Right eval Left eval  Shoulder flexion 5 5  Shoulder extension 5 5  Shoulder abduction 5 5  Shoulder adduction      Shoulder extension      Shoulder internal rotation 5 5  Shoulder external rotation 5 5  Middle trapezius      Lower trapezius      Elbow flexion 5 5  Elbow extension 5 5  Wrist  flexion 5 5  Wrist extension 5 5  Wrist ulnar deviation      Wrist radial deviation      Wrist pronation      Wrist supination      Grip strength 66# 72#   (Blank rows = not tested)   CERVICAL SPECIAL TESTS:  Cranial cervical flexion test: Negative     FUNCTIONAL TESTS:  N/a     TODAY'S TREATMENT:  [facility] Adult PT Treatment:  DATE: 11/13/21 Therapeutic Exercise: UBE L 2.0 6 min (3/3) B UT stretch 30s x2 B levator stretch 30s x2 Supine B flexion 15x focus on breathing patterns using physioball Supine hor abd RTB 2x10x focus on breathing Supine alt. UE flexion 10/10 x2 1# Chin tucks over 1/2 roll 3x10 Curl ups 15x PPT 3s 15x Supine heel taps w/PPT 30s x2 90/90 30s x2 Open book 10/10 Quadruped bird dog 10/10   [facility] Adult PT Treatment:                                                DATE: 11/09/21 Therapeutic Exercise: UBE L2 3/3 min Supine flexion w/cane 15x emphasizing breathing patterns Supine alt UE flexion 15/15 w/PPT Supine hor abd YTB 15x emphasizing breathing patterns Chin tuck 1/2 roll 2x10 PPT 3s 15x Supine march w/PPT 15/15 Open book 10/10 with breathing patterns  [facility] Adult PT Treatment:                                                DATE: 10/29/21 Therapeutic Exercise: UBE L 1.5 6 min (3/3) B UT stretch 30s x2 B levator stretch 30s x2 Supine B flexion 15x focus on breathing patterns Supine hor abd RTB 15x focus on breathing Supine alt. UE flexion 15/15 1# Chin tucks over 1/2 roll 3x10 Curl ups 15x PPT 3s 15x Supine march w/PPT 15/15 Open book 10/10 Quadruped bird dog 10/10   [facility] Adult PT Treatment:                                                DATE: 10/22/21 Therapeutic Exercise: UBE L1 5 min B UT stretch 30s x2 B levator stretch 30s x2 Supine B flexion 15x focus on breathing patterns Supine hor abd YTB 15x focus on breathing Supine alt. UE flexion 15/15 Chin tucks over 1/2 roll 3x10 Curl ups  15x Open book 10/10      PATIENT EDUCATION:  Education details: Discussed eval findings, rehab rationale and POC and patient is in agreement  Person educated: Patient Education method: Explanation Education comprehension: verbalized understanding and needs further education     HOME EXERCISE PROGRAM: Access Code: [email or web address] URL: [email or web address] Date: 10/22/2021 Prepared by: Sharlynn Oliphant  Exercises - Doorway Pec Stretch at 90 Degrees Abduction  - 2 x daily - 7 x weekly - 1 sets - 3 reps - 30s hold - Supine Cervical Retraction with Towel  - 2 x daily - 7 x weekly - 3 sets - 10 reps - 3s hold - Sidelying Open Book Thoracic Lumbar Rotation and Extension  - 2 x daily - 7 x weekly - 1 sets - 10 reps ASSESSMENT:   CLINICAL IMPRESSION: Able to return to ADLs with less discomfort and restriction, flexibility returning.  Today's session increased difficulty and challenge as noted.    OBJECTIVE IMPAIRMENTS decreased activity tolerance, decreased knowledge of condition, decreased mobility, decreased ROM, hypomobility, increased fascial restrictions, impaired flexibility, impaired UE functional use, and pain.    ACTIVITY LIMITATIONS carrying, lifting, sitting, and standing   PARTICIPATION LIMITATIONS: driving and occupation   PERSONAL  FACTORS Fitness, Profession, and Time since onset of injury/illness/exacerbation are also affecting patient's functional outcome.    REHAB POTENTIAL: Good   CLINICAL DECISION MAKING: Stable/uncomplicated   EVALUATION COMPLEXITY: Moderate     GOALS: Goals reviewed with patient? No   SHORT TERM GOALS: Target date: 11/02/2021    Patient to demonstrate independence in HEP  Baseline: [id number] Goal status: Met   2.  Decrease pain to 6/10 when driving Baseline: 3/71 when driving; 0/62/69 4-8/54 when driving; 09/04/01 5/00  Goal status: met       LONG TERM GOALS: Target date: 11/23/2021   Increase cervical SB and L rotation to  75% Baseline:  Active ROM A/PROM (deg) eval  Flexion 90%  Extension 50%  Right lateral flexion 25%  Left lateral flexion 25%  Right rotation 90%  Left rotation 50%    Goal status: INITIAL   2.  Decrease ODI to 10/50 Baseline: 16/50 Goal status: INITIAL   3.  Decrease average pain to 4/10 Baseline: 6/10 average pain Goal status: INITIAL   4.  Patient able to demo neutral posture when cued Baseline: fwd head and rounded shoulder posture Goal status: INITIAL         PLAN: PT FREQUENCY: 1-2x/week   PT DURATION: 6 weeks   PLANNED INTERVENTIONS: Therapeutic exercises, Therapeutic activity, Neuromuscular re-education, Balance training, Gait training, Patient/Family education, Self Care, Joint mobilization, Joint manipulation, Dry Needling, Manual therapy, and Re-evaluation   PLAN FOR NEXT SESSION: HEP f/u, postural training, flexibility and mobility tasks, assess goal progress and CROM     Lanice Shirts, PT 11/13/2021, 11:32 AM

## 2021-12-26 DIAGNOSIS — R69 Illness, unspecified: Secondary | ICD-10-CM | POA: Diagnosis not present

## 2021-12-28 DIAGNOSIS — E559 Vitamin D deficiency, unspecified: Secondary | ICD-10-CM | POA: Diagnosis not present

## 2021-12-28 DIAGNOSIS — I10 Essential (primary) hypertension: Secondary | ICD-10-CM | POA: Diagnosis not present

## 2022-01-01 DIAGNOSIS — R69 Illness, unspecified: Secondary | ICD-10-CM | POA: Diagnosis not present

## 2022-01-09 DIAGNOSIS — C44311 Basal cell carcinoma of skin of nose: Secondary | ICD-10-CM | POA: Diagnosis not present

## 2022-01-09 DIAGNOSIS — L814 Other melanin hyperpigmentation: Secondary | ICD-10-CM | POA: Diagnosis not present

## 2022-01-17 DIAGNOSIS — I10 Essential (primary) hypertension: Secondary | ICD-10-CM | POA: Diagnosis not present

## 2022-01-24 DIAGNOSIS — R69 Illness, unspecified: Secondary | ICD-10-CM | POA: Diagnosis not present

## 2022-01-29 DIAGNOSIS — R69 Illness, unspecified: Secondary | ICD-10-CM | POA: Diagnosis not present

## 2022-02-04 DIAGNOSIS — F1120 Opioid dependence, uncomplicated: Secondary | ICD-10-CM | POA: Diagnosis not present

## 2022-02-05 DIAGNOSIS — R69 Illness, unspecified: Secondary | ICD-10-CM | POA: Diagnosis not present

## 2022-02-07 DIAGNOSIS — R69 Illness, unspecified: Secondary | ICD-10-CM | POA: Diagnosis not present

## 2022-02-08 DIAGNOSIS — L820 Inflamed seborrheic keratosis: Secondary | ICD-10-CM | POA: Diagnosis not present

## 2022-02-08 DIAGNOSIS — D225 Melanocytic nevi of trunk: Secondary | ICD-10-CM | POA: Diagnosis not present

## 2022-02-13 DIAGNOSIS — F1120 Opioid dependence, uncomplicated: Secondary | ICD-10-CM | POA: Diagnosis not present

## 2022-02-22 DIAGNOSIS — F1120 Opioid dependence, uncomplicated: Secondary | ICD-10-CM | POA: Diagnosis not present

## 2022-02-26 DIAGNOSIS — R69 Illness, unspecified: Secondary | ICD-10-CM | POA: Diagnosis not present

## 2022-03-13 DIAGNOSIS — F1120 Opioid dependence, uncomplicated: Secondary | ICD-10-CM | POA: Diagnosis not present

## 2022-03-26 DIAGNOSIS — R69 Illness, unspecified: Secondary | ICD-10-CM | POA: Diagnosis not present

## 2022-04-01 DIAGNOSIS — F1120 Opioid dependence, uncomplicated: Secondary | ICD-10-CM | POA: Diagnosis not present

## 2022-04-09 DIAGNOSIS — F1120 Opioid dependence, uncomplicated: Secondary | ICD-10-CM | POA: Diagnosis not present

## 2022-04-16 DIAGNOSIS — F1120 Opioid dependence, uncomplicated: Secondary | ICD-10-CM | POA: Diagnosis not present

## 2022-04-23 DIAGNOSIS — F1120 Opioid dependence, uncomplicated: Secondary | ICD-10-CM | POA: Diagnosis not present

## 2022-05-09 DIAGNOSIS — F1120 Opioid dependence, uncomplicated: Secondary | ICD-10-CM | POA: Diagnosis not present

## 2022-05-13 DIAGNOSIS — F1120 Opioid dependence, uncomplicated: Secondary | ICD-10-CM | POA: Diagnosis not present

## 2022-05-21 DIAGNOSIS — F1120 Opioid dependence, uncomplicated: Secondary | ICD-10-CM | POA: Diagnosis not present

## 2022-06-05 DIAGNOSIS — F1120 Opioid dependence, uncomplicated: Secondary | ICD-10-CM | POA: Diagnosis not present

## 2022-06-11 DIAGNOSIS — F1120 Opioid dependence, uncomplicated: Secondary | ICD-10-CM | POA: Diagnosis not present

## 2022-06-18 DIAGNOSIS — F1120 Opioid dependence, uncomplicated: Secondary | ICD-10-CM | POA: Diagnosis not present

## 2022-06-24 DIAGNOSIS — F1120 Opioid dependence, uncomplicated: Secondary | ICD-10-CM | POA: Diagnosis not present

## 2022-07-02 DIAGNOSIS — F1120 Opioid dependence, uncomplicated: Secondary | ICD-10-CM | POA: Diagnosis not present

## 2022-07-17 ENCOUNTER — Other Ambulatory Visit: Payer: Self-pay | Admitting: Cardiology

## 2022-07-17 DIAGNOSIS — F1120 Opioid dependence, uncomplicated: Secondary | ICD-10-CM | POA: Diagnosis not present

## 2022-07-30 DIAGNOSIS — F1120 Opioid dependence, uncomplicated: Secondary | ICD-10-CM | POA: Diagnosis not present

## 2022-08-13 DIAGNOSIS — F1120 Opioid dependence, uncomplicated: Secondary | ICD-10-CM | POA: Diagnosis not present

## 2022-08-23 DIAGNOSIS — F1120 Opioid dependence, uncomplicated: Secondary | ICD-10-CM | POA: Diagnosis not present

## 2022-08-27 DIAGNOSIS — F1120 Opioid dependence, uncomplicated: Secondary | ICD-10-CM | POA: Diagnosis not present

## 2022-08-29 DIAGNOSIS — M25462 Effusion, left knee: Secondary | ICD-10-CM | POA: Diagnosis not present

## 2022-08-29 DIAGNOSIS — M546 Pain in thoracic spine: Secondary | ICD-10-CM | POA: Diagnosis not present

## 2022-08-29 DIAGNOSIS — M545 Low back pain, unspecified: Secondary | ICD-10-CM | POA: Diagnosis not present

## 2022-09-06 DIAGNOSIS — M1712 Unilateral primary osteoarthritis, left knee: Secondary | ICD-10-CM | POA: Diagnosis not present

## 2022-09-11 DIAGNOSIS — F1120 Opioid dependence, uncomplicated: Secondary | ICD-10-CM | POA: Diagnosis not present

## 2022-09-13 DIAGNOSIS — F1120 Opioid dependence, uncomplicated: Secondary | ICD-10-CM | POA: Diagnosis not present

## 2022-09-19 DIAGNOSIS — F1120 Opioid dependence, uncomplicated: Secondary | ICD-10-CM | POA: Diagnosis not present

## 2022-10-11 DIAGNOSIS — M1712 Unilateral primary osteoarthritis, left knee: Secondary | ICD-10-CM | POA: Diagnosis not present

## 2022-10-15 DIAGNOSIS — F1120 Opioid dependence, uncomplicated: Secondary | ICD-10-CM | POA: Diagnosis not present

## 2022-10-22 DIAGNOSIS — F1120 Opioid dependence, uncomplicated: Secondary | ICD-10-CM | POA: Diagnosis not present

## 2022-10-24 ENCOUNTER — Other Ambulatory Visit: Payer: Self-pay | Admitting: General Practice

## 2022-10-29 DIAGNOSIS — F1120 Opioid dependence, uncomplicated: Secondary | ICD-10-CM | POA: Diagnosis not present

## 2022-11-14 DIAGNOSIS — F1120 Opioid dependence, uncomplicated: Secondary | ICD-10-CM | POA: Diagnosis not present

## 2022-11-26 DIAGNOSIS — F1120 Opioid dependence, uncomplicated: Secondary | ICD-10-CM | POA: Diagnosis not present

## 2022-12-24 DIAGNOSIS — F1120 Opioid dependence, uncomplicated: Secondary | ICD-10-CM | POA: Diagnosis not present

## 2023-01-06 DIAGNOSIS — Z124 Encounter for screening for malignant neoplasm of cervix: Secondary | ICD-10-CM | POA: Diagnosis not present

## 2023-01-10 ENCOUNTER — Other Ambulatory Visit: Payer: Self-pay | Admitting: Family Medicine

## 2023-01-10 DIAGNOSIS — E28319 Asymptomatic premature menopause: Secondary | ICD-10-CM

## 2023-01-10 DIAGNOSIS — F17200 Nicotine dependence, unspecified, uncomplicated: Secondary | ICD-10-CM

## 2023-01-10 DIAGNOSIS — Z1231 Encounter for screening mammogram for malignant neoplasm of breast: Secondary | ICD-10-CM

## 2023-01-17 DIAGNOSIS — M1712 Unilateral primary osteoarthritis, left knee: Secondary | ICD-10-CM | POA: Diagnosis not present

## 2023-01-23 ENCOUNTER — Encounter: Payer: Self-pay | Admitting: Family Medicine

## 2023-01-24 ENCOUNTER — Ambulatory Visit
Admission: RE | Admit: 2023-01-24 | Discharge: 2023-01-24 | Disposition: A | Discharge status: Home / Self Care | Payer: 59 | Source: Ambulatory Visit | Attending: Family Medicine | Admitting: Family Medicine

## 2023-01-24 DIAGNOSIS — F17200 Nicotine dependence, unspecified, uncomplicated: Secondary | ICD-10-CM

## 2023-01-24 DIAGNOSIS — F17210 Nicotine dependence, cigarettes, uncomplicated: Secondary | ICD-10-CM | POA: Diagnosis not present

## 2023-01-28 DIAGNOSIS — F1120 Opioid dependence, uncomplicated: Secondary | ICD-10-CM | POA: Diagnosis not present

## 2023-02-05 ENCOUNTER — Ambulatory Visit
Admission: RE | Admit: 2023-02-05 | Discharge: 2023-02-05 | Disposition: A | Discharge status: Home / Self Care | Payer: 59 | Source: Ambulatory Visit | Attending: Family Medicine | Admitting: Family Medicine

## 2023-02-05 DIAGNOSIS — Z1231 Encounter for screening mammogram for malignant neoplasm of breast: Secondary | ICD-10-CM | POA: Diagnosis not present

## 2023-02-17 DIAGNOSIS — M1712 Unilateral primary osteoarthritis, left knee: Secondary | ICD-10-CM | POA: Diagnosis not present

## 2023-02-18 ENCOUNTER — Telehealth: Payer: Self-pay

## 2023-02-18 NOTE — Telephone Encounter (Signed)
..     Pre-operative Risk Assessment    Patient Name: Brandy Mccormick  DOB: 02-Nov-1965 MRN: [id number]      Request for Surgical Clearance    Procedure:   LEFT TOTAL KNEE ARTHROPLASTY SURGERY  Date of Surgery:  Clearance TBD                                 Surgeon:  DR Marcene Corning Surgeon's Group or Practice Name:  [facility] Phone number:  [phone] Fax number:  [phone]   Type of Clearance Requested:   - Medical    Type of Anesthesia:  Spinal   Additional requests/questions:   LAST APPT 10/23/2021, NO NEW APPT  Signed, Renee Ramus   02/18/2023, 12:18 PM

## 2023-02-18 NOTE — Telephone Encounter (Signed)
Primary Cardiologist:Peter Swaziland, MD  Chart reviewed as part of pre-operative protocol coverage. Because of Brandy Mccormick past medical history and time since last visit, he/she will require a follow-up visit in order to better assess preoperative cardiovascular risk.  Pre-op covering staff: - Please schedule appointment and call patient to inform them. - Please contact requesting surgeon's office via preferred method (i.e, phone, fax) to inform them of need for appointment prior to surgery.  Levi Aland, NP-C  02/18/2023, 12:28 PM [address] Office [phone] Fax [phone]

## 2023-02-18 NOTE — Telephone Encounter (Signed)
Pt has been scheduled in office appt with Brandy Person, NP 03/10/23 @ 8:50 am. Pt tells me the surgery is being planned for early to mid Jan 2025.

## 2023-02-20 DIAGNOSIS — M1712 Unilateral primary osteoarthritis, left knee: Secondary | ICD-10-CM | POA: Diagnosis not present

## 2023-02-20 DIAGNOSIS — R531 Weakness: Secondary | ICD-10-CM | POA: Diagnosis not present

## 2023-02-20 DIAGNOSIS — M25662 Stiffness of left knee, not elsewhere classified: Secondary | ICD-10-CM | POA: Diagnosis not present

## 2023-02-25 DIAGNOSIS — F1120 Opioid dependence, uncomplicated: Secondary | ICD-10-CM | POA: Diagnosis not present

## 2023-03-10 ENCOUNTER — Encounter: Payer: Self-pay | Admitting: Nurse Practitioner

## 2023-03-10 ENCOUNTER — Ambulatory Visit: Payer: 59 | Attending: Nurse Practitioner | Admitting: Emergency Medicine

## 2023-03-10 ENCOUNTER — Other Ambulatory Visit: Payer: Self-pay

## 2023-03-10 VITALS — BP 126/86 | HR 68 | Ht 63.0 in | Wt 168.8 lb

## 2023-03-10 DIAGNOSIS — Z01810 Encounter for preprocedural cardiovascular examination: Secondary | ICD-10-CM | POA: Diagnosis not present

## 2023-03-10 DIAGNOSIS — Z79899 Other long term (current) drug therapy: Secondary | ICD-10-CM

## 2023-03-10 DIAGNOSIS — Z720 Tobacco use: Secondary | ICD-10-CM | POA: Diagnosis not present

## 2023-03-10 DIAGNOSIS — I10 Essential (primary) hypertension: Secondary | ICD-10-CM

## 2023-03-10 DIAGNOSIS — E782 Mixed hyperlipidemia: Secondary | ICD-10-CM

## 2023-03-10 DIAGNOSIS — Z136 Encounter for screening for cardiovascular disorders: Secondary | ICD-10-CM

## 2023-03-10 MED ORDER — ROSUVASTATIN CALCIUM 5 MG PO TABS: 5.0000 mg | ORAL_TABLET | Freq: Every day | ORAL | 3 refills | Status: AC

## 2023-03-10 NOTE — Progress Notes (Signed)
Cardiology Office Note:    Date:  03/10/2023  ID:  Brandy Mccormick, DOB Jun 13, 1965, MRN [id number] PCP: Lind Covert, MD  [facility] Providers Cardiologist:  Peter Swaziland, MD       Patient Profile:      Brandy Mccormick is a 57 year old female with visit pertinent history of hypertension, tobacco abuse.  She established with cardiology on June 2023 with Dr. Swaziland for chest discomfort.  The chest discomfort would come during periods of stress and increased exertion.  Coronary CTA was ordered and completed on July 2023 showing a coronary calcium score of 0 and no evidence of CAD.  She was last seen by Edd Fabian, NP on August 2023 at that time she continued to have some chest discomfort described as a throbbing sensation over her left chest.  It was strongly recommended for her to work on reducing her smoking.      History of Present Illness:  Discussed the use of AI scribe software for clinical note transcription with the patient, who gave verbal consent to proceed.  Brandy Mccormick is a 57 y.o. female who returns for preoperative clearance.   She is scheduled for a left total knee arthroplasty with Dr. Marcene Corning with [facility] with date of surgery to be determined at this time.  Patient arrives to clinic today and notes she has been doing well since the last time we have seen her.  She is without any cardiovascular concerns or complaints at this time.  She notes she had an episode of epigastric pain 2 months ago that was relieved by Mylanta.  She started Wellbutrin last month in order to quit smoking.  She notes that she has smoked 1 pack/day for 40 years and since she started her Wellbutrin she has been able to cut back to about half a pack.  She notes that she at times will get "winded" during times of exertion.  She notes she does have left knee pain and has been going to PT for this.  She is eager to have her surgery done hopefully in late January.  She notes  her diet seems to be and processed foods but she does watch her sugar intake.  She currently drinks 1-2 red bulls every morning.  She denies chest pain, lower extremity edema, fatigue, palpitations, melena, hematuria, hemoptysis, diaphoresis, weakness, presyncope, syncope, orthopnea, and PND.       Review of Systems  Constitutional: Negative for weight gain and weight loss.  Cardiovascular:  Negative for chest pain, claudication, dyspnea on exertion, irregular heartbeat, leg swelling, near-syncope, orthopnea, palpitations, paroxysmal nocturnal dyspnea and syncope.  Respiratory:  Negative for cough, hemoptysis, shortness of breath and snoring.   Gastrointestinal:  Negative for abdominal pain, hematochezia and melena.  Genitourinary:  Negative for hematuria.     See HPI    Studies Reviewed:   EKG Interpretation Date/Time:  Monday March 10 2023 09:00:51 EST Ventricular Rate:  68 PR Interval:  152 QRS Duration:  78 QT Interval:  408 QTC Calculation: 433 R Axis:   9  Text Interpretation: Normal sinus rhythm Confirmed by Rise Paganini [id number]) on 03/10/2023 9:04:02 AM    Coronary CTA 09/14/2021 IMPRESSION: 1. Coronary calcium score of 0.   2. Normal coronary origin with right dominance.   3. Moderate stairstep artifact but the coronaries appear normal. Risk Assessment/Calculations:             Physical Exam:   VS:  BP 126/86 (BP Location: Right  Arm, Patient Position: Sitting, Cuff Size: Normal)   Pulse 68   Ht 5\' 3"  (1.6 m)   Wt 168 lb 12.8 oz (76.6 kg)   SpO2 96%   BMI 29.90 kg/m    Wt Readings from Last 3 Encounters:  03/10/23 168 lb 12.8 oz (76.6 kg)  10/23/21 162 lb 12.8 oz (73.8 kg)  08/22/21 164 lb 3.2 oz (74.5 kg)    Constitutional:      Appearance: Normal and healthy appearance.  HENT:     Head: Normocephalic.  Neck:     Vascular: JVD normal.  Pulmonary:     Effort: Pulmonary effort is normal.     Breath sounds: Normal breath sounds.  Chest:     Chest  wall: Not tender to palpatation.  Cardiovascular:     PMI at left midclavicular line. Normal rate. Regular rhythm. Normal S1. Normal S2.      Murmurs: There is no murmur.     No gallop.  No click. No rub.  Pulses:    Intact distal pulses.  Edema:    Peripheral edema absent.  Musculoskeletal: Normal range of motion.     Cervical back: Normal range of motion and neck supple. Skin:    General: Skin is warm and dry.  Neurological:     General: No focal deficit present.     Mental Status: Alert, oriented to person, place, and time and oriented to person, place and time.  Psychiatric:        Attention and Perception: Attention and perception normal.        Mood and Affect: Mood normal.        Behavior: Behavior is cooperative.        Thought Content: Thought content normal.        Assessment and Plan:  Preoperative Clearance  According to the Revised Cardiac Risk Index (RCRI), her Perioperative Risk of Major Cardiac Event is (%): 0.4. Her Functional Capacity in METs is: 7.01 according to the Duke Activity Status Index (DASI). Therefore, based on ACC/AHA guidelines, patient would be at acceptable risk for the planned procedure without further cardiovascular testing. I will route this recommendation to the requesting party via Epic fax function.   Essential Hypertension  -Blood pressure today 126/86.  Under good control.  Continue periodically checking blood pressures at home.  Continue chlorthalidone 25 mg once daily and lisinopril 20 mg once daily.  I recommended limiting the amount of energy drinks that she is drinking daily.  Hyperlipidemia  -LDL cholesterol 125 on 12/2022.  With her coronary CTA of 0 in 2023 her goal be set to less than 100.  Given her history of ongoing tobacco abuse and family history of heart disease I have recommended lipid-lowering therapy.  Plan to start rosuvastatin 5 mg once daily.  Plan repeat lipid panel and LFTs x 3 months.  Please have PCP follow along as  well.  Tobacco Abuse  -x40 years at 1 pack/day.  Started Wellbutrin last month, now down to half a pack.  Smoking cessation was strongly encouraged.                Dispo:  Return in about 1 year (around 03/09/2024) with Dr. Swaziland.  Signed, Denyce Robert, NP

## 2023-03-10 NOTE — Patient Instructions (Signed)
Medication Instructions:  Start Rosuvastatin (Crestor) 5 mg- take 1 tablet daily  *If you need a refill on your cardiac medications before your next appointment, please call your pharmacy*   Lab Work: Your physician recommends that you return for lab work in 3 months. Fasting Lipid panel & LFTs    Testing/Procedures: NONE ordered at this time of appointment     Follow-Up: At [facility], you and your health needs are our priority.  As part of our continuing mission to provide you with exceptional heart care, we have created designated Provider Care Teams.  These Care Teams include your primary Cardiologist (physician) and Advanced Practice Providers (APPs -  Physician Assistants and Nurse Practitioners) who all work together to provide you with the care you need, when you need it.  We recommend signing up for the patient portal called "MyChart".  Sign up information is provided on this After Visit Summary.  MyChart is used to connect with patients for Virtual Visits (Telemedicine).  Patients are able to view lab/test results, encounter notes, upcoming appointments, etc.  Non-urgent messages can be sent to your provider as well.   To learn more about what you can do with MyChart, go to [email or web address].    Your next appointment:   1 year(s)  Provider:   Peter Swaziland, MD     Other Instructions

## 2023-03-11 DIAGNOSIS — M25662 Stiffness of left knee, not elsewhere classified: Secondary | ICD-10-CM | POA: Diagnosis not present

## 2023-03-11 DIAGNOSIS — R531 Weakness: Secondary | ICD-10-CM | POA: Diagnosis not present

## 2023-03-11 DIAGNOSIS — M1712 Unilateral primary osteoarthritis, left knee: Secondary | ICD-10-CM | POA: Diagnosis not present

## 2023-03-17 DIAGNOSIS — R531 Weakness: Secondary | ICD-10-CM | POA: Diagnosis not present

## 2023-03-17 DIAGNOSIS — M25662 Stiffness of left knee, not elsewhere classified: Secondary | ICD-10-CM | POA: Diagnosis not present

## 2023-03-17 DIAGNOSIS — M1712 Unilateral primary osteoarthritis, left knee: Secondary | ICD-10-CM | POA: Diagnosis not present

## 2023-03-18 DIAGNOSIS — F1120 Opioid dependence, uncomplicated: Secondary | ICD-10-CM | POA: Diagnosis not present

## 2023-03-25 DIAGNOSIS — M1712 Unilateral primary osteoarthritis, left knee: Secondary | ICD-10-CM | POA: Diagnosis not present

## 2023-03-25 DIAGNOSIS — R531 Weakness: Secondary | ICD-10-CM | POA: Diagnosis not present

## 2023-03-25 DIAGNOSIS — M25662 Stiffness of left knee, not elsewhere classified: Secondary | ICD-10-CM | POA: Diagnosis not present

## 2023-03-26 ENCOUNTER — Other Ambulatory Visit: Payer: Self-pay | Admitting: General Practice

## 2023-04-16 DIAGNOSIS — M25562 Pain in left knee: Secondary | ICD-10-CM | POA: Diagnosis not present

## 2023-04-16 DIAGNOSIS — M1712 Unilateral primary osteoarthritis, left knee: Secondary | ICD-10-CM | POA: Diagnosis not present

## 2023-04-29 DIAGNOSIS — F1120 Opioid dependence, uncomplicated: Secondary | ICD-10-CM | POA: Diagnosis not present

## 2023-05-13 ENCOUNTER — Other Ambulatory Visit: Payer: Self-pay | Admitting: Orthopaedic Surgery

## 2023-05-23 DIAGNOSIS — F1021 Alcohol dependence, in remission: Secondary | ICD-10-CM | POA: Diagnosis not present

## 2023-05-23 DIAGNOSIS — E785 Hyperlipidemia, unspecified: Secondary | ICD-10-CM | POA: Diagnosis not present

## 2023-05-23 DIAGNOSIS — R32 Unspecified urinary incontinence: Secondary | ICD-10-CM | POA: Diagnosis not present

## 2023-05-23 DIAGNOSIS — Z8249 Family history of ischemic heart disease and other diseases of the circulatory system: Secondary | ICD-10-CM | POA: Diagnosis not present

## 2023-05-23 DIAGNOSIS — M545 Low back pain, unspecified: Secondary | ICD-10-CM | POA: Diagnosis not present

## 2023-05-23 DIAGNOSIS — I10 Essential (primary) hypertension: Secondary | ICD-10-CM | POA: Diagnosis not present

## 2023-05-23 DIAGNOSIS — M1990 Unspecified osteoarthritis, unspecified site: Secondary | ICD-10-CM | POA: Diagnosis not present

## 2023-05-23 DIAGNOSIS — F17210 Nicotine dependence, cigarettes, uncomplicated: Secondary | ICD-10-CM | POA: Diagnosis not present

## 2023-05-23 DIAGNOSIS — Z791 Long term (current) use of non-steroidal anti-inflammatories (NSAID): Secondary | ICD-10-CM | POA: Diagnosis not present

## 2023-05-23 DIAGNOSIS — M4800 Spinal stenosis, site unspecified: Secondary | ICD-10-CM | POA: Diagnosis not present

## 2023-05-23 DIAGNOSIS — F1421 Cocaine dependence, in remission: Secondary | ICD-10-CM | POA: Diagnosis not present

## 2023-05-25 NOTE — Progress Notes (Signed)
 COVID Vaccine received:  []  No [x]  Yes Date of any COVID positive Test in last 90 days:  PCP - Lind Covert, MD at [facility] [phone]  Cardiologist - Peter Swaziland, MD Rise Paganini, NP  Cardiac clearance in 03-10-2023 Epic note  Chest x-ray - 06-22-2016  2v  Epic EKG -  03-10-2023  Epic Stress Test -  ECHO -  Cardiac Cath -  CT cardiac calcium score of 0 on 09-14-2021  PCR screen: [x]  Ordered & Completed []   No Order but Needs PROFEND     []   N/A for this surgery  Surgery Plan:  [x]  Ambulatory   []  Outpatient in bed  []  Admit Anesthesia:    []  General  [x]  Spinal  []   Choice []   MAC  Pacemaker / ICD device [x]  No []  Yes   Spinal Cord Stimulator:[x]  No []  Yes       History of Sleep Apnea? [x]  No []  Yes   CPAP used?- [x]  No []  Yes    Does the patient monitor blood sugar?   [x]  N/A   []  No []  Yes  Patient has: [x]  NO Hx DM   []  Pre-DM   []  DM1  []   DM2 Last A1c was:5.6 on    01-06-23   Blood Thinner / Instructions: none Aspirin Instructions:   none  ERAS Protocol Ordered: []  No  [x]  Yes PRE-SURGERY [x]  ENSURE  []  G2   Patient is to be NPO after: 0430  Dental hx: []  Dentures:  []  N/A      []  Bridge or Partial:                   []  Loose or Damaged teeth:   Comments: Patient was given the 5 CHG shower / bath instructions for TKA surgery along with 2 bottles of the CHG soap. Patient will start this on: 05-30-2023 All questions were asked and answered, Patient voiced understanding of this process.   Activity level: Patient is able / unable to climb a flight of stairs without difficulty; []  No CP  []  No SOB, but would have ___   Patient can / can not perform ADLs without assistance.   Anesthesia review: HTN, Smoker, Depression  Patient denies shortness of breath, fever, cough and chest pain at PAT appointment.  Patient verbalized understanding and agreement to the Pre-Surgical Instructions that were given to them at this PAT appointment. Patient was also  educated of the need to review these PAT instructions again prior to her surgery.I reviewed the appropriate phone numbers to call if they have any and questions or concerns.

## 2023-05-25 NOTE — Patient Instructions (Signed)
 SURGICAL WAITING ROOM VISITATION Patients having surgery or a procedure may have no more than 2 support people in the waiting area - these visitors may rotate in the visitor waiting room.   Due to an increase in RSV and influenza rates and associated hospitalizations, children ages 34 and under may not visit patients in [facility] hospitals. If the patient needs to stay at the hospital during part of their recovery, the visitor guidelines for inpatient rooms apply.  PRE-OP VISITATION  Pre-op nurse will coordinate an appropriate time for 1 support person to accompany the patient in pre-op.  This support person may not rotate.  This visitor will be contacted when the time is appropriate for the visitor to come back in the pre-op area.  Please refer to the [facility] website for the visitor guidelines for Inpatients (after your surgery is over and you are in a regular room).  You are not required to quarantine at this time prior to your surgery. However, you must do this: Hand Hygiene often Do NOT share personal items Notify your provider if you are in close contact with someone who has COVID or you develop fever 100.4 or greater, new onset of sneezing, cough, sore throat, shortness of breath or body aches.  If you test positive for Covid or have been in contact with anyone that has tested positive in the last 10 days please notify you surgeon.    Your procedure is scheduled on:  TUESDAY  June 03, 2023  Report to [facility] Main Entrance: [facility] entrance where the [employer] is available.   Report to admitting at:  05:15   AM  Call this number if you have any questions or problems the morning of surgery [phone]  Do not eat food after Midnight the night prior to your surgery/procedure.  After Midnight you may have the following liquids until  04:30 AM DAY OF SURGERY  Clear Liquid Diet Water Black Coffee (sugar ok, NO MILK/CREAM OR CREAMERS)  Tea (sugar ok, NO  MILK/CREAM OR CREAMERS) regular and decaf                             Plain Jell-O  with no fruit (NO RED)                                           Fruit ices (not with fruit pulp, NO RED)                                     Popsicles (NO RED)                                                                  Juice: NO CITRUS JUICES: only apple, WHITE grape, WHITE cranberry Sports drinks like Gatorade or Powerade (NO RED)                   The day of surgery:  Drink ONE (1) Pre-Surgery Clear Ensure at  04:30 AM the morning of surgery. Drink  in one sitting. Do not sip.  This drink was given to you during your hospital pre-op appointment visit. Nothing else to drink after completing the Pre-Surgery Clear Ensure : No candy, chewing gum or throat lozenges.    FOLLOW ANY ADDITIONAL PRE OP INSTRUCTIONS YOU RECEIVED FROM YOUR SURGEON'S OFFICE!!!   Oral Hygiene is also important to reduce your risk of infection.        Remember - BRUSH YOUR TEETH THE MORNING OF SURGERY WITH YOUR REGULAR TOOTHPASTE  Do NOT smoke after Midnight the night before surgery.  STOP TAKING all Vitamins, Herbs and supplements 1 week before your surgery.   Take ONLY these medicines the morning of surgery with A SIP OF WATER:  None                     You may not have any metal on your body including hair pins, jewelry, and body piercing  Do not wear make-up, lotions, powders, perfumes  or deodorant  Do not wear nail polish including gel and S&S, artificial / acrylic nails, or any other type of covering on natural nails including finger and toenails. If you have artificial nails, gel coating, etc., that needs to be removed by a nail salon, Please have this removed prior to surgery. Not doing so may mean that your surgery could be cancelled or delayed if the Surgeon or anesthesia staff feels like they are unable to monitor you safely.   Do not shave 48 hours prior to surgery to avoid nicks in your skin which may  contribute to postoperative infections.   Contacts, Hearing Aids, dentures or bridgework may not be worn into surgery. DENTURES WILL BE REMOVED PRIOR TO SURGERY PLEASE DO NOT APPLY "Poly grip" OR ADHESIVES!!!  Patients discharged on the day of surgery will not be allowed to drive home.  Someone NEEDS to stay with you for the first 24 hours after anesthesia.  Do not bring your home medications to the hospital. The Pharmacy will dispense medications listed on your medication list to you during your admission in the Hospital.  Please read over the following fact sheets you were given: IF YOU HAVE QUESTIONS ABOUT YOUR PRE-OP INSTRUCTIONS, PLEASE CALL [phone].     Pre-operative 5 CHG Bath Instructions   You can play a key role in reducing the risk of infection after surgery. Your skin needs to be as free of germs as possible. You can reduce the number of germs on your skin by washing with CHG (chlorhexidine gluconate) soap before surgery. CHG is an antiseptic soap that kills germs and continues to kill germs even after washing.   DO NOT use if you have an allergy to chlorhexidine/CHG or antibacterial soaps. If your skin becomes reddened or irritated, stop using the CHG and notify one of our RNs at [phone]  Please shower with the CHG soap starting 4 days before surgery using the following schedule: START SHOWERS ON   FRIDAY  May 30, 2023  Please keep in mind the following:  DO NOT shave, including legs and underarms, starting the day of your first shower.   You may shave your face at any point before/day of surgery.   Place clean sheets on your bed the day you start using CHG soap. Use a clean washcloth (not used since being washed) for each shower. DO NOT sleep with pets once you start using the CHG.   CHG  Shower Instructions:  If you choose to wash your hair and private area, wash first with your normal shampoo/soap.  After you use shampoo/soap, rinse your hair and body thoroughly to remove shampoo/soap residue.  Turn the water OFF and apply about 3 tablespoons (45 ml) of CHG soap to a CLEAN washcloth.  Apply CHG soap ONLY FROM YOUR NECK DOWN TO YOUR TOES (washing for 3-5 minutes)  DO NOT use CHG soap on face, private areas, open wounds, or sores.  Pay special attention to the area where your surgery is being performed.  If you are having back surgery, having someone wash your back for you may be helpful.  Wait 2 minutes after CHG soap is applied, then you may rinse off the CHG soap.  Pat dry with a clean towel  Put on clean clothes/pajamas   If you choose to wear lotion, please use ONLY the CHG-compatible lotions on the back of this paper.     Additional instructions for the day of surgery: DO NOT APPLY any lotions, deodorants, cologne, or perfumes.   Put on clean/comfortable clothes.  Brush your teeth.  Ask your nurse before applying any prescription medications to the skin.      CHG Compatible Lotions   Aveeno Moisturizing lotion  Cetaphil Moisturizing Cream  Cetaphil Moisturizing Lotion  Clairol Herbal Essence Moisturizing Lotion, Dry Skin  Clairol Herbal Essence Moisturizing Lotion, Extra Dry Skin  Clairol Herbal Essence Moisturizing Lotion, Normal Skin  Curel Age Defying Therapeutic Moisturizing Lotion with Alpha Hydroxy  Curel Extreme Care Body Lotion  Curel Soothing Hands Moisturizing Hand Lotion  Curel Therapeutic Moisturizing Cream, Fragrance-Free  Curel Therapeutic Moisturizing Lotion, Fragrance-Free  Curel Therapeutic Moisturizing Lotion, Original Formula  Eucerin Daily Replenishing Lotion  Eucerin Dry Skin Therapy Plus Alpha Hydroxy Crme  Eucerin Dry Skin Therapy Plus Alpha Hydroxy Lotion  Eucerin Original Crme  Eucerin Original Lotion  Eucerin Plus Crme  Eucerin Plus Lotion  Eucerin TriLipid Replenishing Lotion  Keri Anti-Bacterial Hand Lotion  Keri Deep Conditioning Original Lotion Dry Skin Formula Softly Scented  Keri Deep Conditioning Original Lotion, Fragrance Free Sensitive Skin Formula  Keri Lotion Fast Absorbing Fragrance Free Sensitive Skin Formula  Keri Lotion Fast Absorbing Softly Scented Dry Skin Formula  Keri Original Lotion  Keri Skin Renewal Lotion Keri Silky Smooth Lotion  Keri Silky Smooth Sensitive Skin Lotion  Nivea Body Creamy Conditioning Oil  Nivea Body Extra Enriched Lotion  Nivea Body Original Lotion  Nivea Body Sheer Moisturizing Lotion Nivea Crme  Nivea Skin Firming Lotion  NutraDerm 30 Skin Lotion  NutraDerm Skin Lotion  NutraDerm Therapeutic Skin Cream  NutraDerm Therapeutic Skin Lotion  ProShield Protective Hand Cream  Provon moisturizing lotion   FAILURE TO FOLLOW THESE INSTRUCTIONS MAY RESULT IN THE CANCELLATION OF YOUR SURGERY  PATIENT SIGNATURE_________________________________  NURSE SIGNATURE__________________________________  ________________________________________________________________________        Rogelia Mire    An incentive spirometer is a tool that can help keep your lungs clear and active. This tool measures how well you are filling your lungs with each breath.  Taking long deep breaths may help reverse or decrease the chance of developing breathing (pulmonary) problems (especially infection) following: A long period of time when you are unable to move or be active. BEFORE THE PROCEDURE  If the spirometer includes an indicator to show your best effort, your nurse or respiratory therapist will set it to a desired goal. If possible, sit up straight or lean slightly forward. Try not to slouch. Hold the incentive spirometer in an upright position. INSTRUCTIONS FOR USE  Sit on the edge of your bed if possible, or sit up as far as you can in bed or on a chair. Hold the  incentive spirometer in an upright position. Breathe out normally. Place the mouthpiece in your mouth and seal your lips tightly around it. Breathe in slowly and as deeply as possible, raising the piston or the ball toward the top of the column. Hold your breath for 3-5 seconds or for as long as possible. Allow the piston or ball to fall to the bottom of the column. Remove the mouthpiece from your mouth and breathe out normally. Rest for a few seconds and repeat Steps 1 through 7 at least 10 times every 1-2 hours when you are awake. Take your time and take a few normal breaths between deep breaths. The spirometer may include an indicator to show your best effort. Use the indicator as a goal to work toward during each repetition. After each set of 10 deep breaths, practice coughing to be sure your lungs are clear. If you have an incision (the cut made at the time of surgery), support your incision when coughing by placing a pillow or rolled up towels firmly against it. Once you are able to get out of bed, walk around indoors and cough well. You may stop using the incentive spirometer when instructed by your caregiver.  RISKS AND COMPLICATIONS Take your time so you do not get dizzy or light-headed. If you are in pain, you may need to take or ask for pain medication before doing incentive spirometry. It is harder to take a deep breath if you are having pain. AFTER USE Rest and breathe slowly and easily. It can be helpful to keep track of a log of your progress. Your caregiver can provide you with a simple table to help with this. If you are using the spirometer at home, follow these instructions: SEEK MEDICAL CARE IF:  You are having difficultly using the spirometer. You have trouble using the spirometer as often as instructed. Your pain medication is not giving enough relief while using the spirometer. You develop fever of 100.5 F (38.1 C) or higher.                                                                                                     SEEK IMMEDIATE MEDICAL CARE IF:  You cough up bloody sputum that had not been present before. You develop fever of 102 F (38.9 C) or greater. You develop worsening pain at or near the incision site. MAKE SURE YOU:  Understand these instructions. Will watch your  condition. Will get help right away if you are not doing well or get worse. Document Released: 07/08/2006 Document Revised: 05/20/2011 Document Reviewed: 09/08/2006 [facility] Patient Information 2014 [place], [place].       If you would like to see a video about joint replacement:   [email or web address]

## 2023-05-27 ENCOUNTER — Other Ambulatory Visit: Payer: Self-pay

## 2023-05-27 ENCOUNTER — Encounter (HOSPITAL_COMMUNITY): Payer: Self-pay

## 2023-05-27 ENCOUNTER — Encounter (HOSPITAL_COMMUNITY)
Admission: RE | Admit: 2023-05-27 | Discharge: 2023-05-27 | Disposition: A | Discharge status: Home / Self Care | Source: Ambulatory Visit | Attending: Orthopaedic Surgery | Admitting: Orthopaedic Surgery

## 2023-05-27 VITALS — BP 152/84 | HR 66 | Temp 98.6°F | Resp 16 | Ht 63.0 in | Wt 166.0 lb

## 2023-05-27 DIAGNOSIS — I10 Essential (primary) hypertension: Secondary | ICD-10-CM | POA: Diagnosis not present

## 2023-05-27 DIAGNOSIS — Z01812 Encounter for preprocedural laboratory examination: Secondary | ICD-10-CM | POA: Diagnosis not present

## 2023-05-27 DIAGNOSIS — Z01818 Encounter for other preprocedural examination: Secondary | ICD-10-CM

## 2023-05-27 DIAGNOSIS — F17200 Nicotine dependence, unspecified, uncomplicated: Secondary | ICD-10-CM

## 2023-05-27 HISTORY — DX: Malignant (primary) neoplasm, unspecified: C80.1

## 2023-05-27 LAB — CBC
HCT: 40.3 % (ref 36.0–46.0)
Hemoglobin: 14 g/dL (ref 12.0–15.0)
MCH: 31 pg (ref 26.0–34.0)
MCHC: 34.7 g/dL (ref 30.0–36.0)
MCV: 89.2 fL (ref 80.0–100.0)
Platelets: 306 10*3/uL (ref 150–400)
RBC: 4.52 MIL/uL (ref 3.87–5.11)
RDW: 13.3 % (ref 11.5–15.5)
WBC: 6.6 10*3/uL (ref 4.0–10.5)
nRBC: 0 % (ref 0.0–0.2)

## 2023-05-27 LAB — BASIC METABOLIC PANEL
Anion gap: 8 (ref 5–15)
BUN: 13 mg/dL (ref 6–20)
CO2: 26 mmol/L (ref 22–32)
Calcium: 9 mg/dL (ref 8.9–10.3)
Chloride: 104 mmol/L (ref 98–111)
Creatinine, Ser: 0.76 mg/dL (ref 0.44–1.00)
GFR, Estimated: >60 mL/min (ref >60)
Glucose, Bld: 91 mg/dL (ref 70–99)
Potassium: 3.9 mmol/L (ref 3.5–5.1)
Sodium: 138 mmol/L (ref 135–145)

## 2023-05-27 LAB — SURGICAL PCR SCREEN
MRSA, PCR: NEGATIVE
Staphylococcus aureus: NEGATIVE

## 2023-06-02 MED ORDER — TRANEXAMIC ACID 1000 MG/10ML IV SOLN
2000.0000 mg | INTRAVENOUS | Status: DC
  Filled 2023-06-02: qty 20

## 2023-06-02 NOTE — Anesthesia Preprocedure Evaluation (Signed)
 Anesthesia Evaluation  Patient identified by MRN, date of birth, ID band Patient awake    Reviewed: Allergy & Precautions, NPO status , Patient's Chart, lab work & pertinent test results  History of Anesthesia Complications Negative for: history of anesthetic complications  Airway Mallampati: II  TM Distance: >3 FB Neck ROM: Full    Dental  (+) Dental Advisory Given   Pulmonary Current Smoker and Patient abstained from smoking.   Pulmonary exam normal        Cardiovascular hypertension, Pt. on medications Normal cardiovascular exam     Neuro/Psych  PSYCHIATRIC DISORDERS  Depression    negative neurological ROS     GI/Hepatic negative GI ROS, Neg liver ROS,,,  Endo/Other  negative endocrine ROS    Renal/GU negative Renal ROS     Musculoskeletal  (+) Arthritis ,    Abdominal   Peds  Hematology negative hematology ROS (+)   Anesthesia Other Findings   Reproductive/Obstetrics  s/p tubal ligation                              Anesthesia Physical Anesthesia Plan  ASA: 2  Anesthesia Plan: Spinal   Post-op Pain Management: Tylenol PO (pre-op)* and Regional block*   Induction:   PONV Risk Score and Plan: 1 and Treatment may vary due to age or medical condition and Propofol infusion  Airway Management Planned: Natural Airway and Simple Face Mask  Additional Equipment: None  Intra-op Plan:   Post-operative Plan:   Informed Consent: I have reviewed the patients History and Physical, chart, labs and discussed the procedure including the risks, benefits and alternatives for the proposed anesthesia with the patient or authorized representative who has indicated his/her understanding and acceptance.       Plan Discussed with: CRNA and Anesthesiologist  Anesthesia Plan Comments: (Labs reviewed, platelets acceptable. Discussed risks and benefits of spinal, including spinal/epidural  hematoma, infection, failed block, and PDPH. Patient expressed understanding and wished to proceed. )       Anesthesia Quick Evaluation

## 2023-06-02 NOTE — H&P (Signed)
 TOTAL KNEE ADMISSION H&P  Patient is being admitted for left total knee arthroplasty.  Subjective:  Chief Complaint:left knee pain.  HPI: Brandy Mccormick, 58 y.o. female, has a history of pain and functional disability in the left knee due to arthritis and has failed non-surgical conservative treatments for greater than 12 weeks to includeNSAID's and/or analgesics, corticosteriod injections, viscosupplementation injections, flexibility and strengthening excercises, supervised PT with diminished ADL's post treatment, use of assistive devices, weight reduction as appropriate, and activity modification.  Onset of symptoms was gradual, starting 5 years ago with gradually worsening course since that time. The patient noted no past surgery on the left knee(s).  Patient currently rates pain in the left knee(s) at 10 out of 10 with activity. Patient has night pain, worsening of pain with activity and weight bearing, pain that interferes with activities of daily living, crepitus, and joint swelling.  Patient has evidence of subchondral cysts, subchondral sclerosis, periarticular osteophytes, and joint space narrowing by imaging studies. There is no active infection.  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   Depression    Hypertension    Surgical history of tubal ligation 03/11/2000    Past Surgical History:  Procedure Laterality Date   CARPAL TUNNEL RELEASE Left    COLONOSCOPY  2017   DILATATION & CURETTAGE/HYSTEROSCOPY WITH MYOSURE N/A 07/19/2015   Procedure: DILATATION & CURETTAGE/HYSTEROSCOPY WITH MYOSURE;  Surgeon: Myna Hidalgo, DO;  Location: [facility];  Service: Gynecology;  Laterality: N/A;  Polypectomy   KNEE ARTHROSCOPY Right 4/17   LAPAROSCOPY N/A 07/19/2015   Procedure: LAPAROSCOPY DIAGNOSTIC, EXPLORATORY LAPAROTOMY;  Surgeon: Myna Hidalgo, DO;  Location: [facility] OR;  Service: Gynecology;  Laterality: N/A;   WISDOM TOOTH EXTRACTION      Current Facility-Administered Medications  Medication Dose Route Frequency Provider Last Rate Last Admin   [START ON 06/03/2023] tranexamic acid (CYKLOKAPRON) 2,000 mg in sodium chloride 0.9 % 50 mL Topical Application  2,000 mg Topical To OR Pricilla Riffle, RPH       Current Outpatient Medications  Medication Sig Dispense Refill Last Dose/Taking   chlorthalidone (HYGROTON) 25 MG tablet Take 1 tablet (25 mg total) by mouth daily. 90 tablet 3 Taking   lisinopril (ZESTRIL) 10 MG tablet Take 10 mg by mouth daily.   Taking   meloxicam (MOBIC) 15 MG tablet Take 15 mg by mouth daily as needed for pain.   Taking As Needed   rosuvastatin (CRESTOR) 5 MG tablet Take 1 tablet (5 mg total) by mouth daily. 90 tablet 3 Taking   buPROPion (WELLBUTRIN XL) 150 MG 24 hr tablet Take 150 mg by mouth every morning.      HYDROcodone-acetaminophen (NORCO/VICODIN) 5-325 MG tablet Take 1-2 tablets by mouth every 6 (six) hours as needed for severe pain (pain score 7-10).      methocarbamol (ROBAXIN) 500 MG tablet Take 1 tablet (500 mg total) by mouth every 8 (eight) hours as needed for muscle spasms. (Patient not taking: Reported on 05/22/2023) 40 tablet 0 Not Taking   No Known Allergies  Social History   Tobacco Use   Smoking status: Every Day    Current packs/day: 0.25    Average packs/day: 0.3 packs/day for 47.2 years (11.8 ttl pk-yrs)    Types: Cigarettes    Start date: 65   Smokeless  tobacco: Never  Substance Use Topics   Alcohol use: Yes    Comment: weekends    Family History  Problem Relation Age of Onset   Heart attack Mother    Peripheral Artery Disease Mother    Arrhythmia Father    Cancer Other      Review of Systems  Musculoskeletal:  Positive for arthralgias.       Left knee  All other systems reviewed and are negative.   Objective:  Physical Exam Constitutional:      Appearance:  Normal appearance.  HENT:     Head: Normocephalic and atraumatic.     Nose: Nose normal.     Mouth/Throat:     Pharynx: Oropharynx is clear.  Eyes:     Extraocular Movements: Extraocular movements intact.  Pulmonary:     Effort: Pulmonary effort is normal.  Abdominal:     Palpations: Abdomen is soft.  Musculoskeletal:     Cervical back: Normal range of motion.     Comments: Left knee motion remains about 5-115.  She has medial joint line pain and crepitation.  Hip motion is good and straight leg raise is negative.  I do not feel an effusion today.    Skin:    General: Skin is warm and dry.  Neurological:     General: No focal deficit present.     Mental Status: She is alert and oriented to person, place, and time. Mental status is at baseline.  Psychiatric:        Mood and Affect: Mood normal.        Behavior: Behavior normal.        Thought Content: Thought content normal.        Judgment: Judgment normal.     Vital signs in last 24 hours:    Labs:   Estimated body mass index is 29.41 kg/m as calculated from the following:   Height as of 05/27/23: 5\' 3"  (1.6 m).   Weight as of 05/27/23: 75.3 kg.   Imaging Review Plain radiographs demonstrate severe degenerative joint disease of the left knee(s). The overall alignment isneutral. The bone quality appears to be good for age and reported activity level.      Assessment/Plan:  End stage primary arthritis, left knee   The patient history, physical examination, clinical judgment of the provider and imaging studies are consistent with end stage degenerative joint disease of the left knee(s) and total knee arthroplasty is deemed medically necessary. The treatment options including medical management, injection therapy arthroscopy and arthroplasty were discussed at length. The risks and benefits of total knee arthroplasty were presented and reviewed. The risks due to aseptic loosening, infection, stiffness, patella tracking  problems, thromboembolic complications and other imponderables were discussed. The patient acknowledged the explanation, agreed to proceed with the plan and consent was signed. Patient is being admitted for inpatient treatment for surgery, pain control, PT, OT, prophylactic antibiotics, VTE prophylaxis, progressive ambulation and ADL's and discharge planning. The patient is planning to be discharged home with home health services   Patient's anticipated LOS is less than 2 midnights, meeting these requirements: - Younger than 44 - Lives within 1 hour of care - Has a competent adult at home to recover with post-op recover - NO history of  - Chronic pain requiring opiods  - Diabetes  - Coronary Artery Disease  - Heart failure  - Heart attack  - Stroke  - DVT/VTE  - Cardiac arrhythmia  - Respiratory Failure/COPD  -  Renal failure  - Anemia  - Advanced Liver disease

## 2023-06-03 ENCOUNTER — Other Ambulatory Visit: Payer: Self-pay

## 2023-06-03 ENCOUNTER — Ambulatory Visit (HOSPITAL_COMMUNITY): Payer: Self-pay | Admitting: Anesthesiology

## 2023-06-03 ENCOUNTER — Ambulatory Visit (HOSPITAL_COMMUNITY)
Admission: RE | Admit: 2023-06-03 | Discharge: 2023-06-03 | Disposition: A | Discharge status: Home / Self Care | Payer: 59 | Source: Ambulatory Visit | Attending: Orthopaedic Surgery | Admitting: Orthopaedic Surgery

## 2023-06-03 ENCOUNTER — Ambulatory Visit (HOSPITAL_BASED_OUTPATIENT_CLINIC_OR_DEPARTMENT_OTHER): Payer: Self-pay | Admitting: Anesthesiology

## 2023-06-03 ENCOUNTER — Encounter (HOSPITAL_COMMUNITY): Payer: Self-pay | Admitting: Orthopaedic Surgery

## 2023-06-03 ENCOUNTER — Encounter (HOSPITAL_COMMUNITY)
Admission: RE | Disposition: A | Discharge status: Home / Self Care | Payer: Self-pay | Source: Ambulatory Visit | Attending: Orthopaedic Surgery

## 2023-06-03 DIAGNOSIS — M1712 Unilateral primary osteoarthritis, left knee: Secondary | ICD-10-CM

## 2023-06-03 DIAGNOSIS — Z96652 Presence of left artificial knee joint: Secondary | ICD-10-CM | POA: Diagnosis not present

## 2023-06-03 DIAGNOSIS — G8918 Other acute postprocedural pain: Secondary | ICD-10-CM | POA: Diagnosis not present

## 2023-06-03 DIAGNOSIS — Z79899 Other long term (current) drug therapy: Secondary | ICD-10-CM | POA: Diagnosis not present

## 2023-06-03 DIAGNOSIS — I10 Essential (primary) hypertension: Secondary | ICD-10-CM | POA: Insufficient documentation

## 2023-06-03 DIAGNOSIS — M170 Bilateral primary osteoarthritis of knee: Secondary | ICD-10-CM | POA: Diagnosis not present

## 2023-06-03 DIAGNOSIS — F17210 Nicotine dependence, cigarettes, uncomplicated: Secondary | ICD-10-CM | POA: Diagnosis not present

## 2023-06-03 DIAGNOSIS — Z9851 Tubal ligation status: Secondary | ICD-10-CM | POA: Insufficient documentation

## 2023-06-03 HISTORY — PX: TOTAL KNEE ARTHROPLASTY: SHX125

## 2023-06-03 SURGERY — ARTHROPLASTY, KNEE, TOTAL
Anesthesia: Spinal | Site: Knee | Laterality: Left

## 2023-06-03 MED ORDER — ROPIVACAINE HCL 7.5 MG/ML IJ SOLN
INTRAMUSCULAR | Status: DC | PRN
  Administered 2023-06-03: 20 mL via PERINEURAL

## 2023-06-03 MED ORDER — METOCLOPRAMIDE HCL 5 MG PO TABS: 5.0000 mg | ORAL_TABLET | Freq: Three times a day (TID) | ORAL | Status: DC | PRN

## 2023-06-03 MED ORDER — FENTANYL CITRATE PF 50 MCG/ML IJ SOSY: 25.0000 ug | PREFILLED_SYRINGE | INTRAMUSCULAR | Status: DC | PRN

## 2023-06-03 MED ORDER — EPHEDRINE SULFATE (PRESSORS) 50 MG/ML IJ SOLN
INTRAMUSCULAR | Status: DC | PRN
Start: 2023-06-03 — End: 2023-06-03
  Administered 2023-06-03 (×2): 5 mg via INTRAVENOUS

## 2023-06-03 MED ORDER — CHLORHEXIDINE GLUCONATE 0.12 % MT SOLN
15.0000 mL | Freq: Once | OROMUCOSAL | Status: AC
  Administered 2023-06-03: 15 mL via OROMUCOSAL

## 2023-06-03 MED ORDER — MIDAZOLAM HCL 5 MG/5ML IJ SOLN
INTRAMUSCULAR | Status: DC | PRN
  Administered 2023-06-03: 2 mg via INTRAVENOUS

## 2023-06-03 MED ORDER — ONDANSETRON HCL 4 MG PO TABS: 4.0000 mg | ORAL_TABLET | Freq: Four times a day (QID) | ORAL | Status: DC | PRN

## 2023-06-03 MED ORDER — HYDROCODONE-ACETAMINOPHEN 5-325 MG PO TABS
ORAL_TABLET | ORAL | Status: AC
  Filled 2023-06-03: qty 2

## 2023-06-03 MED ORDER — MIDAZOLAM HCL 2 MG/2ML IJ SOLN
INTRAMUSCULAR | Status: AC
  Filled 2023-06-03: qty 2

## 2023-06-03 MED ORDER — AMISULPRIDE (ANTIEMETIC) 5 MG/2ML IV SOLN: 10.0000 mg | Freq: Once | INTRAVENOUS | Status: DC | PRN

## 2023-06-03 MED ORDER — EPHEDRINE 5 MG/ML INJ
INTRAVENOUS | Status: AC
  Filled 2023-06-03: qty 5

## 2023-06-03 MED ORDER — LACTATED RINGERS IV SOLN: INTRAVENOUS | Status: DC

## 2023-06-03 MED ORDER — METOCLOPRAMIDE HCL 5 MG/ML IJ SOLN: 5.0000 mg | Freq: Three times a day (TID) | INTRAMUSCULAR | Status: DC | PRN

## 2023-06-03 MED ORDER — TRANEXAMIC ACID-NACL 1000-0.7 MG/100ML-% IV SOLN: 1000.0000 mg | Freq: Once | INTRAVENOUS | Status: DC

## 2023-06-03 MED ORDER — ACETAMINOPHEN 500 MG PO TABS
1000.0000 mg | ORAL_TABLET | Freq: Once | ORAL | Status: AC
  Administered 2023-06-03: 1000 mg via ORAL
  Filled 2023-06-03: qty 2

## 2023-06-03 MED ORDER — SODIUM CHLORIDE (PF) 0.9 % IJ SOLN
INTRAMUSCULAR | Status: AC
  Filled 2023-06-03: qty 30

## 2023-06-03 MED ORDER — ONDANSETRON HCL 4 MG/2ML IJ SOLN
INTRAMUSCULAR | Status: DC | PRN
  Administered 2023-06-03: 4 mg via INTRAVENOUS

## 2023-06-03 MED ORDER — LACTATED RINGERS IV BOLUS
500.0000 mL | Freq: Once | INTRAVENOUS | Status: AC
  Administered 2023-06-03: 500 mL via INTRAVENOUS

## 2023-06-03 MED ORDER — TRANEXAMIC ACID-NACL 1000-0.7 MG/100ML-% IV SOLN
1000.0000 mg | INTRAVENOUS | Status: AC
  Administered 2023-06-03: 1000 mg via INTRAVENOUS
  Filled 2023-06-03: qty 100

## 2023-06-03 MED ORDER — ONDANSETRON HCL 4 MG/2ML IJ SOLN
4.0000 mg | Freq: Four times a day (QID) | INTRAMUSCULAR | Status: DC | PRN
Start: 2023-06-03 — End: 2023-06-03

## 2023-06-03 MED ORDER — MORPHINE SULFATE (PF) 2 MG/ML IV SOLN: 0.5000 mg | INTRAVENOUS | Status: DC | PRN

## 2023-06-03 MED ORDER — FENTANYL CITRATE (PF) 100 MCG/2ML IJ SOLN
INTRAMUSCULAR | Status: DC | PRN
  Administered 2023-06-03: 50 ug via INTRAVENOUS

## 2023-06-03 MED ORDER — DEXAMETHASONE SODIUM PHOSPHATE 10 MG/ML IJ SOLN
INTRAMUSCULAR | Status: AC
  Filled 2023-06-03: qty 1

## 2023-06-03 MED ORDER — METHOCARBAMOL 500 MG PO TABS
500.0000 mg | ORAL_TABLET | Freq: Four times a day (QID) | ORAL | Status: DC | PRN
Start: 2023-06-03 — End: 2023-06-03

## 2023-06-03 MED ORDER — METHOCARBAMOL 1000 MG/10ML IJ SOLN: 500.0000 mg | Freq: Four times a day (QID) | INTRAMUSCULAR | Status: DC | PRN

## 2023-06-03 MED ORDER — POVIDONE-IODINE 10 % EX SWAB: 2.0000 | Freq: Once | CUTANEOUS | Status: DC

## 2023-06-03 MED ORDER — ONDANSETRON HCL 4 MG/2ML IJ SOLN
INTRAMUSCULAR | Status: AC
  Filled 2023-06-03: qty 2

## 2023-06-03 MED ORDER — DEXAMETHASONE SODIUM PHOSPHATE 10 MG/ML IJ SOLN
INTRAMUSCULAR | Status: DC | PRN
  Administered 2023-06-03: 8 mg via INTRAVENOUS

## 2023-06-03 MED ORDER — BUPIVACAINE-EPINEPHRINE (PF) 0.5% -1:200000 IJ SOLN
INTRAMUSCULAR | Status: AC
  Filled 2023-06-03: qty 30

## 2023-06-03 MED ORDER — HYDROCODONE-ACETAMINOPHEN 5-325 MG PO TABS
1.0000 | ORAL_TABLET | ORAL | Status: DC | PRN
  Administered 2023-06-03: 2 via ORAL

## 2023-06-03 MED ORDER — SODIUM CHLORIDE (PF) 0.9 % IJ SOLN
INTRAMUSCULAR | Status: DC | PRN
  Administered 2023-06-03: 80 mL

## 2023-06-03 MED ORDER — ASPIRIN 81 MG PO TBEC: 81.0000 mg | DELAYED_RELEASE_TABLET | Freq: Two times a day (BID) | ORAL | 0 refills | Status: AC

## 2023-06-03 MED ORDER — LACTATED RINGERS IV BOLUS: 250.0000 mL | Freq: Once | INTRAVENOUS | Status: DC

## 2023-06-03 MED ORDER — PHENYLEPHRINE HCL-NACL 20-0.9 MG/250ML-% IV SOLN
INTRAVENOUS | Status: AC
  Filled 2023-06-03: qty 250

## 2023-06-03 MED ORDER — CEFAZOLIN SODIUM-DEXTROSE 2-4 GM/100ML-% IV SOLN: 2.0000 g | Freq: Four times a day (QID) | INTRAVENOUS | Status: DC

## 2023-06-03 MED ORDER — PROPOFOL 1000 MG/100ML IV EMUL
INTRAVENOUS | Status: AC
  Filled 2023-06-03: qty 100

## 2023-06-03 MED ORDER — STERILE WATER FOR IRRIGATION IR SOLN
Status: DC | PRN
  Administered 2023-06-03: 1000 mL

## 2023-06-03 MED ORDER — LACTATED RINGERS IV BOLUS
250.0000 mL | Freq: Once | INTRAVENOUS | Status: AC
  Administered 2023-06-03: 250 mL via INTRAVENOUS

## 2023-06-03 MED ORDER — BUPIVACAINE IN DEXTROSE 0.75-8.25 % IT SOLN
INTRATHECAL | Status: DC | PRN
  Administered 2023-06-03: 1.6 mL via INTRATHECAL

## 2023-06-03 MED ORDER — FENTANYL CITRATE PF 50 MCG/ML IJ SOSY
PREFILLED_SYRINGE | INTRAMUSCULAR | Status: AC
  Filled 2023-06-03: qty 2

## 2023-06-03 MED ORDER — LIDOCAINE HCL (PF) 2 % IJ SOLN
INTRAMUSCULAR | Status: AC
Start: 2023-06-03 — End: ?
  Filled 2023-06-03: qty 5

## 2023-06-03 MED ORDER — LIDOCAINE HCL (CARDIAC) PF 100 MG/5ML IV SOSY
PREFILLED_SYRINGE | INTRAVENOUS | Status: DC | PRN
  Administered 2023-06-03: 30 mg via INTRAVENOUS
  Administered 2023-06-03: 20 mg via INTRAVENOUS

## 2023-06-03 MED ORDER — OXYCODONE HCL 5 MG PO TABS
ORAL_TABLET | ORAL | Status: AC
  Filled 2023-06-03: qty 1

## 2023-06-03 MED ORDER — ORAL CARE MOUTH RINSE: 15.0000 mL | Freq: Once | OROMUCOSAL | Status: AC

## 2023-06-03 MED ORDER — FENTANYL CITRATE (PF) 100 MCG/2ML IJ SOLN
INTRAMUSCULAR | Status: AC
  Filled 2023-06-03: qty 2

## 2023-06-03 MED ORDER — BUPIVACAINE LIPOSOME 1.3 % IJ SUSP
INTRAMUSCULAR | Status: AC
  Filled 2023-06-03: qty 20

## 2023-06-03 MED ORDER — BUPIVACAINE LIPOSOME 1.3 % IJ SUSP: 20.0000 mL | Freq: Once | INTRAMUSCULAR | Status: DC

## 2023-06-03 MED ORDER — TRANEXAMIC ACID 1000 MG/10ML IV SOLN
INTRAVENOUS | Status: DC | PRN
  Administered 2023-06-03: 2000 mg via TOPICAL

## 2023-06-03 MED ORDER — HYDROCODONE-ACETAMINOPHEN 5-325 MG PO TABS: 1.0000 | ORAL_TABLET | Freq: Four times a day (QID) | ORAL | 0 refills | Status: AC | PRN

## 2023-06-03 MED ORDER — OXYCODONE HCL 5 MG/5ML PO SOLN: 5.0000 mg | Freq: Once | ORAL | Status: AC | PRN

## 2023-06-03 MED ORDER — KETOROLAC TROMETHAMINE 15 MG/ML IJ SOLN: 15.0000 mg | Freq: Four times a day (QID) | INTRAMUSCULAR | Status: DC

## 2023-06-03 MED ORDER — HYDROCODONE-ACETAMINOPHEN 7.5-325 MG PO TABS: 1.0000 | ORAL_TABLET | ORAL | Status: DC | PRN

## 2023-06-03 MED ORDER — SODIUM CHLORIDE 0.9 % IR SOLN
Status: DC | PRN
  Administered 2023-06-03: 1000 mL

## 2023-06-03 MED ORDER — METHOCARBAMOL 500 MG PO TABS: 500.0000 mg | ORAL_TABLET | Freq: Four times a day (QID) | ORAL | 0 refills | Status: AC | PRN

## 2023-06-03 MED ORDER — 0.9 % SODIUM CHLORIDE (POUR BTL) OPTIME
TOPICAL | Status: DC | PRN
  Administered 2023-06-03: 1000 mL

## 2023-06-03 MED ORDER — OXYCODONE HCL 5 MG PO TABS
5.0000 mg | ORAL_TABLET | Freq: Once | ORAL | Status: AC | PRN
  Administered 2023-06-03: 5 mg via ORAL

## 2023-06-03 MED ORDER — KETOROLAC TROMETHAMINE 15 MG/ML IJ SOLN
INTRAMUSCULAR | Status: AC
  Administered 2023-06-03: 15 mg via INTRAVENOUS
  Filled 2023-06-03: qty 1

## 2023-06-03 MED ORDER — PROPOFOL 500 MG/50ML IV EMUL
INTRAVENOUS | Status: DC | PRN
Start: 2023-06-03 — End: 2023-06-03
  Administered 2023-06-03: 50 ug/kg/min via INTRAVENOUS

## 2023-06-03 MED ORDER — CEFAZOLIN SODIUM-DEXTROSE 2-4 GM/100ML-% IV SOLN
2.0000 g | INTRAVENOUS | Status: AC
  Administered 2023-06-03: 2 g via INTRAVENOUS
  Filled 2023-06-03: qty 100

## 2023-06-03 MED ORDER — CEFAZOLIN SODIUM-DEXTROSE 2-4 GM/100ML-% IV SOLN
INTRAVENOUS | Status: AC
  Administered 2023-06-03: 2 g via INTRAVENOUS
  Filled 2023-06-03: qty 100

## 2023-06-03 MED ORDER — SODIUM CHLORIDE 0.9 % IV SOLN: 12.5000 mg | INTRAVENOUS | Status: DC | PRN

## 2023-06-03 SURGICAL SUPPLY — 50 items
ATTUNE MED DOME PAT 38 KNEE (Knees) IMPLANT
ATTUNE PS FEM LT SZ 4 CEM KNEE (Femur) IMPLANT
ATTUNE PSRP INSR SZ4 6 KNEE (Insert) IMPLANT
BAG COUNTER SPONGE SURGICOUNT (BAG) ×2 IMPLANT
BAG DECANTER FOR FLEXI CONT (MISCELLANEOUS) ×2 IMPLANT
BAG ZIPLOCK 12X15 (MISCELLANEOUS) ×2 IMPLANT
BASE TIBIA ATTUNE KNEE SYS SZ6 (Knees) IMPLANT
BLADE SAGITTAL 25.0X1.19X90 (BLADE) ×2 IMPLANT
BLADE SAW SGTL 11.0X1.19X90.0M (BLADE) ×2 IMPLANT
BLADE SURG SZ10 CARB STEEL (BLADE) ×2 IMPLANT
BNDG ELASTIC 6INX 5YD STR LF (GAUZE/BANDAGES/DRESSINGS) ×2 IMPLANT
BOOTIES KNEE HIGH SLOAN (MISCELLANEOUS) ×2 IMPLANT
BOWL SMART MIX CTS (DISPOSABLE) ×2 IMPLANT
CEMENT HV SMART SET (Cement) ×4 IMPLANT
COVER SURGICAL LIGHT HANDLE (MISCELLANEOUS) ×2 IMPLANT
CUFF TRNQT CYL 34X4.125X (TOURNIQUET CUFF) ×2 IMPLANT
DRAPE TOP 10253 STERILE (DRAPES) ×2 IMPLANT
DRAPE U-SHAPE 47X51 STRL (DRAPES) ×2 IMPLANT
DRSG AQUACEL AG ADV 3.5X10 (GAUZE/BANDAGES/DRESSINGS) ×2 IMPLANT
DURAPREP 26ML APPLICATOR (WOUND CARE) ×4 IMPLANT
ELECT REM PT RETURN 15FT ADLT (MISCELLANEOUS) ×2 IMPLANT
GLOVE BIO SURGEON STRL SZ8 (GLOVE) ×4 IMPLANT
GLOVE BIOGEL PI IND STRL 7.0 (GLOVE) ×2 IMPLANT
GLOVE BIOGEL PI IND STRL 8 (GLOVE) ×4 IMPLANT
GLOVE SURG SYN 7.0 (GLOVE) ×1 IMPLANT
GLOVE SURG SYN 7.0 PF PI (GLOVE) ×2 IMPLANT
GOWN SRG XL LVL 4 BRTHBL STRL (GOWNS) ×2 IMPLANT
GOWN STRL REUS W/ TWL XL LVL3 (GOWN DISPOSABLE) ×4 IMPLANT
HOLDER FOLEY CATH W/STRAP (MISCELLANEOUS) IMPLANT
HOOD PEEL AWAY T7 (MISCELLANEOUS) ×6 IMPLANT
KIT TURNOVER KIT A (KITS) IMPLANT
MANIFOLD NEPTUNE II (INSTRUMENTS) ×2 IMPLANT
NS IRRIG 1000ML POUR BTL (IV SOLUTION) ×2 IMPLANT
PACK TOTAL KNEE CUSTOM (KITS) ×2 IMPLANT
PAD ARMBOARD POSITIONER FOAM (MISCELLANEOUS) ×2 IMPLANT
PIN STEINMAN FIXATION KNEE (PIN) IMPLANT
PROTECTOR NERVE ULNAR (MISCELLANEOUS) ×2 IMPLANT
SET HNDPC FAN SPRY TIP SCT (DISPOSABLE) ×2 IMPLANT
SPIKE FLUID TRANSFER (MISCELLANEOUS) ×4 IMPLANT
SUT ETHIBOND NAB CT1 #1 30IN (SUTURE) ×2 IMPLANT
SUT STRATAFIX 0 PDS 27 VIOLET (SUTURE) ×1 IMPLANT
SUT VIC AB 0 CT1 36 (SUTURE) ×2 IMPLANT
SUT VIC AB 2-0 CT1 TAPERPNT 27 (SUTURE) ×2 IMPLANT
SUT VICRYL AB 3-0 FS1 BRD 27IN (SUTURE) ×2 IMPLANT
SUTURE STRATFX 0 PDS 27 VIOLET (SUTURE) ×2 IMPLANT
TIBIA ATTUNE KNEE SYS BASE SZ6 (Knees) ×1 IMPLANT
TRAY FOLEY MTR SLVR 16FR STAT (SET/KITS/TRAYS/PACK) IMPLANT
WATER STERILE IRR 1000ML POUR (IV SOLUTION) ×4 IMPLANT
WRAP KNEE MAXI GEL POST OP (GAUZE/BANDAGES/DRESSINGS) ×2 IMPLANT
YANKAUER SUCT BULB TIP NO VENT (SUCTIONS) ×2 IMPLANT

## 2023-06-03 NOTE — Op Note (Signed)
 PREOP DIAGNOSIS: DJD LEFT KNEE POSTOP DIAGNOSIS:  same PROCEDURE: LEFT TKR ANESTHESIA: Spinal and MAC ATTENDING SURGEON: Velna Ochs ASSISTANT: Elodia Florence PA  INDICATIONS FOR PROCEDURE: Brandy Mccormick is a 58 y.o. female who has struggled for a long time with pain due to degenerative arthritis of the left knee.  The patient has failed many conservative non-operative measures and at this point has pain which limits the ability to sleep and walk.  The patient is offered total knee replacement.  Informed operative consent was obtained after discussion of possible risks of anesthesia, infection, neurovascular injury, DVT, and death.  The importance of the post-operative rehabilitation protocol to optimize result was stressed extensively with the patient.  SUMMARY OF FINDINGS AND PROCEDURE:  Brandy Mccormick was taken to the operative suite where under the above anesthesia a left knee replacement was performed.  There were advanced degenerative changes and the bone quality was good.  We used the DePuyAttune system and placed size 4 femur, 6 tibia, 38 mm all polyethylene patella, and a size 6 mm spacer.  Elodia Florence PA-C assisted throughout and was invaluable to the completion of the case in that he helped retract and maintain exposure while I placed the components.  He also helped close thereby minimizing OR time.  The patient was admitted for appropriate post-op care to include perioperative antibiotics and mechanical and pharmacologic measures for DVT prophylaxis.  DESCRIPTION OF PROCEDURE:  Brandy Mccormick was taken to the operative suite where the above anesthesia was applied.  The patient was positioned supine and prepped and draped in normal sterile fashion.  An appropriate time out was performed.  After the administration of kefzol pre-op antibiotic the leg was elevated and exsanguinated and a tourniquet inflated.  A standard longitudinal incision was made on the anterior knee.  Dissection was  carried down to the extensor mechanism.  All appropriate anti-infective measures were used including the pre-operative antibiotic, betadine impregnated drape, and closed hooded exhaust systems for each member of the surgical team.  A medial parapatellar incision was made in the extensor mechanism and the knee cap flipped and the knee flexed.  Some residual meniscal tissues were removed along with any remaining ACL/PCL tissue.  A guide was placed on the tibia and a flat cut was made on it's superior surface.  An intramedullary guide was placed in the femur and was utilized to make anterior and posterior cuts creating an appropriate flexion gap.  A second intramedullary guide was placed in the femur to make a distal cut properly balancing the knee with an extension gap equal to the flexion gap.  The three bones sized to the above mentioned sizes and the appropriate guides were placed and utilized.  A trial reduction was done and the knee easily came to full extension and the patella tracked well on flexion.  The trial components were removed and all bones were cleaned with pulsatile lavage and then dried thoroughly.  Cement was mixed and was pressurized onto the bones followed by placement of the aforementioned components.  Excess cement was trimmed and pressure was held on the components until the cement had hardened.  The tourniquet was deflated and a small amount of bleeding was controlled with cautery and pressure.  The knee was irrigated thoroughly.  The extensor mechanism was re-approximated with #1 ethibond in interrupted fashion.  The knee was flexed and the repair was solid.  The subcutaneous tissues were re-approximated with #0 and #2-0 vicryl and the skin  closed with a subcuticular stitch and steristrips.  A sterile dressing was applied.  Intraoperative fluids, EBL, and tourniquet time can be obtained from anesthesia records.  DISPOSITION:  The patient was taken to recovery room in stable condition and  scheduled to potentially go home same day depending on ability to walk and tolerate liquids.  Velna Ochs 06/03/2023, 9:10 AM

## 2023-06-03 NOTE — Transfer of Care (Signed)
 Immediate Anesthesia Transfer of Care Note  Patient: MERDITH ADAN  Procedure(s) Performed: LEFT TOTAL KNEE ARTHROPLASTY (Left: Knee)  Patient Location: PACU  Anesthesia Type:Spinal  Level of Consciousness: awake, alert , oriented, and patient cooperative  Airway & Oxygen Therapy: Patient Spontanous Breathing and Patient connected to face mask oxygen  Post-op Assessment: Report given to RN and Post -op Vital signs reviewed and stable  Post vital signs: Reviewed and stable  Last Vitals:  Vitals Value Taken Time  BP 120/71 06/03/23 0934  Temp    Pulse 70 06/03/23 0937  Resp 12 06/03/23 0937  SpO2 100 % 06/03/23 0937  Vitals shown include unfiled device data.  Last Pain:  Vitals:   06/03/23 0620  TempSrc: Oral  PainSc:          Complications: No notable events documented.

## 2023-06-03 NOTE — Anesthesia Procedure Notes (Signed)
 Spinal  Patient location during procedure: OR Start time: 06/03/2023 7:48 AM End time: 06/03/2023 7:51 AM Reason for block: surgical anesthesia Staffing Performed: anesthesiologist  Anesthesiologist: Beryle Lathe, MD Performed by: Beryle Lathe, MD Authorized by: Beryle Lathe, MD   Preanesthetic Checklist Completed: patient identified, IV checked, risks and benefits discussed, surgical consent, monitors and equipment checked, pre-op evaluation and timeout performed Spinal Block Patient position: sitting Prep: DuraPrep Patient monitoring: heart rate, cardiac monitor, continuous pulse ox and blood pressure Approach: midline Location: L3-4 Injection technique: single-shot Needle Needle type: Pencan  Needle gauge: 24 G Additional Notes Consent was obtained prior to the procedure with all questions answered and concerns addressed. Risks including, but not limited to, bleeding, infection, nerve damage, paralysis, failed block, inadequate analgesia, allergic reaction, high spinal, itching, and headache were discussed and the patient wished to proceed. Functioning IV was confirmed and monitors were applied. Sterile prep and drape, including hand hygiene, mask, and sterile gloves were used. The patient was positioned and the spine was prepped. The skin was anesthetized with lidocaine. Free flow of clear CSF was obtained prior to injecting local anesthetic into the CSF. The spinal needle aspirated freely following injection. The needle was carefully withdrawn. The patient tolerated the procedure well.   Leslye Peer, MD

## 2023-06-03 NOTE — Evaluation (Signed)
 Physical Therapy Treatment Patient Details Name: Brandy Mccormick MRN: [id number] DOB: [birth year] Today's Date: 06/03/2023   History of Present Illness Pt is 58 yo female s/p L TKA on 06/03/23.  Pt with hx including but not limited to arthritis, HTN, depression    PT Comments  Pt is s/p TKA resulting in the deficits listed below (see PT Problem List). At baseline, pt independent and working.  She lives in 2 story home so will be staying with mother at d/c and has DME.  PT asked to see pt in PACU for same day d/c.  Today, pt with good quad activation and understanding of HEP.  She had increased pain with exercises but was able to tolerate mobility well.  Pt ambulated 80' and performed stairs similar to home setup.  Pt confident and ready to return home, no further questions for PT.  Pt demonstrates safe gait & transfers in order to return home from PT perspective once discharged by MD.  While in hospital, will continue to benefit from PT for skilled therapy to advance mobility and exercises.       If plan is discharge home, recommend the following: A little help with walking and/or transfers;A little help with bathing/dressing/bathroom;Assistance with cooking/housework;Help with stairs or ramp for entrance   Can travel by private vehicle        Equipment Recommendations  None recommended by PT    Recommendations for Other Services       Precautions / Restrictions Precautions Precautions: Fall;Knee Restrictions Weight Bearing Restrictions Per Provider Order: Yes LLE Weight Bearing Per Provider Order: Weight bearing as tolerated     Mobility  Bed Mobility Overal bed mobility: Needs Assistance Bed Mobility: Supine to Sit     Supine to sit: Contact guard     General bed mobility comments: Self assisted L LE with gait belt    Transfers Overall transfer level: Needs assistance Equipment used: Rolling walker (2 wheels) Transfers: Sit to/from Stand Sit to Stand: Contact guard  assist                Ambulation/Gait Ambulation/Gait assistance: Contact guard assist, Supervision Gait Distance (Feet): 80 Feet Assistive device: Rolling walker (2 wheels) Gait Pattern/deviations: Step-to pattern, Decreased stride length, Decreased weight shift to left Gait velocity: decreased but functional     General Gait Details: steady gait with RW; educated on sequencing and pushing RW   Stairs Stairs: Yes Stairs assistance: Contact guard assist Stair Management: Step to pattern, Forwards Number of Stairs: 3 General stair comments: 2 steps wtih rails to simulate banister on steps then top platform step wtih RW like stepping onto Mining engineer     Tilt Bed    Modified Rankin (Stroke Patients Only)       Balance Overall balance assessment: Needs assistance Sitting-balance support: No upper extremity supported Sitting balance-Leahy Scale: Good     Standing balance support: No upper extremity supported, Bilateral upper extremity supported Standing balance-Leahy Scale: Fair Standing balance comment: RW to ambulate but could static stand without support                            Communication    Cognition Arousal: Alert Behavior During Therapy: WFL for tasks assessed/performed   PT - Cognitive impairments: No apparent impairments  Cueing    Exercises Total Joint Exercises Ankle Circles/Pumps: AROM, Both, 10 reps, Supine Quad Sets: AROM, Both, 5 reps, Supine Heel Slides: AAROM, Left, Supine, Limitations Heel Slides Limitations: gait belt to assist, only 1 rep to demonstrate, limited by pain Hip ABduction/ADduction: AAROM, Left, Limitations, Supine Hip Abduction/Adduction Limitations: gait belt to assist, only 1 rep to demonstrate, limited by pain Long Arc Quad: AROM, Left, Seated, Limitations Long Arc Quad Limitations: only 1 rep to demonstrate, limited by pain Knee Flexion: AROM,  Left, 5 reps, Seated, Limitations Knee Flexion Limitations: limited range due to pain    General Comments General comments (skin integrity, edema, etc.): VSS   Educated on safe ice use, no pivots, car transfers, resting with leg straight, and TED hose during day. Also, encouraged walking every 1-2 hours during day. Educated on HEP with focus on mobility the first weeks. Discussed doing exercises within pain control and if pain increasing could decreased ROM, reps, and stop exercises as needed. Encouraged to perform quad sets and ankle pumps frequently for blood flow and to promote full knee extension.    Pertinent Vitals/Pain Pain Assessment Pain Assessment: 0-10 Pain Score: 3  Pain Location: L Knee Pain Descriptors / Indicators: Throbbing Pain Intervention(s): Limited activity within patient's tolerance, Monitored during session, Premedicated before session, Ice applied    Home Living Family/patient expects to be discharged to:: Private residence Living Arrangements: Other (Comment) (Lives with fiancee and 3 dogs in 2 level home but going to stay with mother at d/c in single level home) Available Help at Discharge: Family;Available 24 hours/day Type of Home: House Home Access: Stairs to enter Entrance Stairs-Rails: None (no rails but has banisters) Entrance Stairs-Number of Steps: 2 (small 4")   Home Layout: One level Home Equipment: BSC/3in1;Rolling Walker (2 wheels);Cane - single point      Prior Function            PT Goals (current goals can now be found in the care plan section) Acute Rehab PT Goals Patient Stated Goal: return home PT Goal Formulation: With patient/family Time For Goal Achievement: 06/17/23 Potential to Achieve Goals: Good    Frequency    7X/week      PT Plan      Co-evaluation              AM-PAC PT "6 Clicks" Mobility   Outcome Measure  Help needed turning from your back to your side while in a flat bed without using bedrails?: A  Little Help needed moving from lying on your back to sitting on the side of a flat bed without using bedrails?: A Little Help needed moving to and from a bed to a chair (including a wheelchair)?: A Little Help needed standing up from a chair using your arms (e.g., wheelchair or bedside chair)?: A Little Help needed to walk in hospital room?: A Little Help needed climbing 3-5 steps with a railing? : A Little 6 Click Score: 18    End of Session Equipment Utilized During Treatment: Gait belt Activity Tolerance: Patient tolerated treatment well Patient left: in chair;with call bell/phone within reach;with family/visitor present (in PACU) Nurse Communication: Mobility status PT Visit Diagnosis: Other abnormalities of gait and mobility (R26.89);Muscle weakness (generalized) (M62.81)     Time: 4098-1191 PT Time Calculation (min) (ACUTE ONLY): 33 min  Charges:    $Gait Training: 8-22 mins PT General Charges $$ ACUTE PT VISIT: 1 Visit  Anise Salvo, PT Acute Rehab [facility] [phone]    Rayetta Humphrey 06/03/2023, 1:34 PM

## 2023-06-03 NOTE — Anesthesia Postprocedure Evaluation (Signed)
 Anesthesia Post Note  Patient: Brandy Mccormick  Procedure(s) Performed: LEFT TOTAL KNEE ARTHROPLASTY (Left: Knee)     Patient location during evaluation: PACU Anesthesia Type: Spinal Level of consciousness: awake and alert Pain management: pain level controlled Vital Signs Assessment: post-procedure vital signs reviewed and stable Respiratory status: spontaneous breathing, nonlabored ventilation and respiratory function stable Cardiovascular status: stable and blood pressure returned to baseline Postop Assessment: spinal receding and no apparent nausea or vomiting Anesthetic complications: no  No notable events documented.  Last Vitals:  Vitals:   06/03/23 1045 06/03/23 1052  BP: 134/72   Pulse: 62 61  Resp: 13 12  Temp:    SpO2: 92% 92%    Last Pain:  Vitals:   06/03/23 1045  TempSrc:   PainSc: Asleep                 Beryle Lathe

## 2023-06-03 NOTE — Interval H&P Note (Signed)
 History and Physical Interval Note:  06/03/2023 7:35 AM  Brandy Mccormick  has presented today for surgery, with the diagnosis of left knee degenerative joint disease.  The various methods of treatment have been discussed with the patient and family. After consideration of risks, benefits and other options for treatment, the patient has consented to  Procedure(s): LEFT TOTAL KNEE ARTHROPLASTY (Left) as a surgical intervention.  The patient's history has been reviewed, patient examined, no change in status, stable for surgery.  I have reviewed the patient's chart and labs.  Questions were answered to the patient's satisfaction.     Velna Ochs

## 2023-06-03 NOTE — Anesthesia Procedure Notes (Signed)
 Anesthesia Regional Block: Adductor canal block   Pre-Anesthetic Checklist: , timeout performed,  Correct Patient, Correct Site, Correct Laterality,  Correct Procedure, Correct Position, site marked,  Risks and benefits discussed,  Surgical consent,  Pre-op evaluation,  At surgeon's request and post-op pain management  Laterality: Left  Prep: chloraprep       Needles:  Injection technique: Single-shot  Needle Type: Echogenic Needle     Needle Length: 10cm  Needle Gauge: 21     Additional Needles:   Narrative:  Start time: 06/03/2023 7:01 AM End time: 06/03/2023 7:04 AM Injection made incrementally with aspirations every 5 mL.  Performed by: Personally  Anesthesiologist: Beryle Lathe, MD  Additional Notes: No pain on injection. No increased resistance to injection. Injection made in 5cc increments. Good needle visualization. Patient tolerated the procedure well.

## 2023-06-04 ENCOUNTER — Encounter (HOSPITAL_COMMUNITY): Payer: Self-pay | Admitting: Orthopaedic Surgery

## 2023-06-05 DIAGNOSIS — M25662 Stiffness of left knee, not elsewhere classified: Secondary | ICD-10-CM | POA: Diagnosis not present

## 2023-06-05 DIAGNOSIS — R262 Difficulty in walking, not elsewhere classified: Secondary | ICD-10-CM | POA: Diagnosis not present

## 2023-06-05 DIAGNOSIS — Z96652 Presence of left artificial knee joint: Secondary | ICD-10-CM | POA: Diagnosis not present

## 2023-06-05 DIAGNOSIS — R531 Weakness: Secondary | ICD-10-CM | POA: Diagnosis not present

## 2023-06-13 DIAGNOSIS — M25662 Stiffness of left knee, not elsewhere classified: Secondary | ICD-10-CM | POA: Diagnosis not present

## 2023-06-18 DIAGNOSIS — R262 Difficulty in walking, not elsewhere classified: Secondary | ICD-10-CM | POA: Diagnosis not present

## 2023-06-18 DIAGNOSIS — R531 Weakness: Secondary | ICD-10-CM | POA: Diagnosis not present

## 2023-06-18 DIAGNOSIS — Z96652 Presence of left artificial knee joint: Secondary | ICD-10-CM | POA: Diagnosis not present

## 2023-06-18 DIAGNOSIS — M25662 Stiffness of left knee, not elsewhere classified: Secondary | ICD-10-CM | POA: Diagnosis not present

## 2023-06-24 DIAGNOSIS — Z96652 Presence of left artificial knee joint: Secondary | ICD-10-CM | POA: Diagnosis not present

## 2023-06-24 DIAGNOSIS — R531 Weakness: Secondary | ICD-10-CM | POA: Diagnosis not present

## 2023-06-24 DIAGNOSIS — R262 Difficulty in walking, not elsewhere classified: Secondary | ICD-10-CM | POA: Diagnosis not present

## 2023-06-24 DIAGNOSIS — M25662 Stiffness of left knee, not elsewhere classified: Secondary | ICD-10-CM | POA: Diagnosis not present

## 2023-07-08 DIAGNOSIS — R531 Weakness: Secondary | ICD-10-CM | POA: Diagnosis not present

## 2023-07-08 DIAGNOSIS — R262 Difficulty in walking, not elsewhere classified: Secondary | ICD-10-CM | POA: Diagnosis not present

## 2023-07-08 DIAGNOSIS — M25662 Stiffness of left knee, not elsewhere classified: Secondary | ICD-10-CM | POA: Diagnosis not present

## 2023-07-08 DIAGNOSIS — Z96652 Presence of left artificial knee joint: Secondary | ICD-10-CM | POA: Diagnosis not present

## 2023-07-10 DIAGNOSIS — R531 Weakness: Secondary | ICD-10-CM | POA: Diagnosis not present

## 2023-07-10 DIAGNOSIS — M25662 Stiffness of left knee, not elsewhere classified: Secondary | ICD-10-CM | POA: Diagnosis not present

## 2023-07-10 DIAGNOSIS — R262 Difficulty in walking, not elsewhere classified: Secondary | ICD-10-CM | POA: Diagnosis not present

## 2023-07-10 DIAGNOSIS — Z96652 Presence of left artificial knee joint: Secondary | ICD-10-CM | POA: Diagnosis not present

## 2023-07-14 DIAGNOSIS — Z96652 Presence of left artificial knee joint: Secondary | ICD-10-CM | POA: Diagnosis not present

## 2023-07-14 DIAGNOSIS — R262 Difficulty in walking, not elsewhere classified: Secondary | ICD-10-CM | POA: Diagnosis not present

## 2023-07-14 DIAGNOSIS — M25662 Stiffness of left knee, not elsewhere classified: Secondary | ICD-10-CM | POA: Diagnosis not present

## 2023-07-14 DIAGNOSIS — R531 Weakness: Secondary | ICD-10-CM | POA: Diagnosis not present

## 2023-07-21 DIAGNOSIS — M25662 Stiffness of left knee, not elsewhere classified: Secondary | ICD-10-CM | POA: Diagnosis not present

## 2023-07-21 DIAGNOSIS — Z96652 Presence of left artificial knee joint: Secondary | ICD-10-CM | POA: Diagnosis not present

## 2023-07-21 DIAGNOSIS — R262 Difficulty in walking, not elsewhere classified: Secondary | ICD-10-CM | POA: Diagnosis not present

## 2023-07-21 DIAGNOSIS — R531 Weakness: Secondary | ICD-10-CM | POA: Diagnosis not present

## 2023-07-24 DIAGNOSIS — R531 Weakness: Secondary | ICD-10-CM | POA: Diagnosis not present

## 2023-07-24 DIAGNOSIS — R262 Difficulty in walking, not elsewhere classified: Secondary | ICD-10-CM | POA: Diagnosis not present

## 2023-07-24 DIAGNOSIS — Z96652 Presence of left artificial knee joint: Secondary | ICD-10-CM | POA: Diagnosis not present

## 2023-07-24 DIAGNOSIS — M25662 Stiffness of left knee, not elsewhere classified: Secondary | ICD-10-CM | POA: Diagnosis not present

## 2023-07-29 DIAGNOSIS — R262 Difficulty in walking, not elsewhere classified: Secondary | ICD-10-CM | POA: Diagnosis not present

## 2023-07-29 DIAGNOSIS — M25662 Stiffness of left knee, not elsewhere classified: Secondary | ICD-10-CM | POA: Diagnosis not present

## 2023-07-29 DIAGNOSIS — R531 Weakness: Secondary | ICD-10-CM | POA: Diagnosis not present

## 2023-07-29 DIAGNOSIS — Z96652 Presence of left artificial knee joint: Secondary | ICD-10-CM | POA: Diagnosis not present

## 2023-07-31 DIAGNOSIS — M25662 Stiffness of left knee, not elsewhere classified: Secondary | ICD-10-CM | POA: Diagnosis not present

## 2023-07-31 DIAGNOSIS — Z96652 Presence of left artificial knee joint: Secondary | ICD-10-CM | POA: Diagnosis not present

## 2023-07-31 DIAGNOSIS — R262 Difficulty in walking, not elsewhere classified: Secondary | ICD-10-CM | POA: Diagnosis not present

## 2023-07-31 DIAGNOSIS — R531 Weakness: Secondary | ICD-10-CM | POA: Diagnosis not present

## 2023-08-27 ENCOUNTER — Other Ambulatory Visit: Payer: 59

## 2023-09-03 DIAGNOSIS — M24562 Contracture, left knee: Secondary | ICD-10-CM | POA: Diagnosis not present

## 2023-09-15 DIAGNOSIS — M1712 Unilateral primary osteoarthritis, left knee: Secondary | ICD-10-CM | POA: Diagnosis not present

## 2023-10-03 DIAGNOSIS — M24562 Contracture, left knee: Secondary | ICD-10-CM | POA: Diagnosis not present

## 2023-10-28 ENCOUNTER — Other Ambulatory Visit (HOSPITAL_BASED_OUTPATIENT_CLINIC_OR_DEPARTMENT_OTHER): Payer: Self-pay | Admitting: Family Medicine

## 2023-10-28 DIAGNOSIS — Z1231 Encounter for screening mammogram for malignant neoplasm of breast: Secondary | ICD-10-CM

## 2023-11-03 DIAGNOSIS — M24562 Contracture, left knee: Secondary | ICD-10-CM | POA: Diagnosis not present

## 2023-12-04 DIAGNOSIS — M24562 Contracture, left knee: Secondary | ICD-10-CM | POA: Diagnosis not present

## 2024-01-03 DIAGNOSIS — M24562 Contracture, left knee: Secondary | ICD-10-CM | POA: Diagnosis not present

## 2024-03-04 DIAGNOSIS — M24562 Contracture, left knee: Secondary | ICD-10-CM | POA: Diagnosis not present
